# Patient Record
Sex: Female | Born: 1935 | Race: White | Hispanic: No | Smoking: Never smoker
Health system: Southern US, Community
[De-identification: ages and names within clinical notes are randomized; demographics above are authoritative.]

## PROBLEM LIST (undated history)

## (undated) DIAGNOSIS — I10 Essential (primary) hypertension: Secondary | ICD-10-CM

## (undated) HISTORY — PX: CHOLECYSTECTOMY: SHX55

## (undated) HISTORY — PX: ABDOMINAL HYSTERECTOMY: SHX81

---

## 2001-04-23 ENCOUNTER — Ambulatory Visit (HOSPITAL_COMMUNITY): Admission: RE | Admit: 2001-04-23 | Discharge: 2001-04-23 | Payer: Self-pay | Admitting: General Surgery

## 2001-08-16 ENCOUNTER — Encounter: Payer: Self-pay | Admitting: Family Medicine

## 2001-08-16 ENCOUNTER — Ambulatory Visit (HOSPITAL_COMMUNITY): Admission: RE | Admit: 2001-08-16 | Discharge: 2001-08-16 | Payer: Self-pay | Admitting: Family Medicine

## 2001-08-23 ENCOUNTER — Ambulatory Visit (HOSPITAL_COMMUNITY): Admission: RE | Admit: 2001-08-23 | Discharge: 2001-08-23 | Payer: Self-pay | Admitting: Family Medicine

## 2001-08-23 ENCOUNTER — Encounter: Payer: Self-pay | Admitting: Family Medicine

## 2002-04-13 ENCOUNTER — Ambulatory Visit (HOSPITAL_COMMUNITY): Admission: RE | Admit: 2002-04-13 | Discharge: 2002-04-13 | Payer: Self-pay | Admitting: Family Medicine

## 2002-04-13 ENCOUNTER — Encounter: Payer: Self-pay | Admitting: Family Medicine

## 2002-10-13 ENCOUNTER — Emergency Department (HOSPITAL_COMMUNITY): Admission: EM | Admit: 2002-10-13 | Discharge: 2002-10-13 | Payer: Self-pay | Admitting: Emergency Medicine

## 2002-10-13 ENCOUNTER — Encounter: Payer: Self-pay | Admitting: Emergency Medicine

## 2005-09-05 ENCOUNTER — Observation Stay (HOSPITAL_COMMUNITY): Admission: EM | Admit: 2005-09-05 | Discharge: 2005-09-07 | Payer: Self-pay | Admitting: Emergency Medicine

## 2006-12-21 ENCOUNTER — Ambulatory Visit (HOSPITAL_COMMUNITY): Admission: RE | Admit: 2006-12-21 | Discharge: 2006-12-21 | Payer: Self-pay | Admitting: Family Medicine

## 2008-04-09 ENCOUNTER — Emergency Department (HOSPITAL_COMMUNITY): Admission: EM | Admit: 2008-04-09 | Discharge: 2008-04-09 | Payer: Self-pay | Admitting: Emergency Medicine

## 2009-05-12 ENCOUNTER — Emergency Department (HOSPITAL_COMMUNITY): Admission: EM | Admit: 2009-05-12 | Discharge: 2009-05-12 | Payer: Self-pay | Admitting: Emergency Medicine

## 2011-03-28 NOTE — H&P (Signed)
Ellen [name], Ellen [name]          ACCOUNT NO.:  [id number]   MEDICAL RECORD NO.:  [id number]          PATIENT TYPE:  OBV   LOCATION:  IC03                          FACILITY:  [facility]   PHYSICIAN:  Angus G. Renard Matter, MD   DATE OF BIRTH:  [birth year]   DATE OF ADMISSION:  09/05/2005  DATE OF DISCHARGE:  LH                                HISTORY & PHYSICAL   A 77 year old white female presented to the ED after episode of syncope  which occurred at home. The patient apparently had had several episodes of  dizziness throughout the day. She apparently had been having problems with  sinus with some pain over forehead. She was standing, states she had episode  of syncope, fell and struck her head on table. She was seen by ED physician  and evaluated. Chest x-ray:  No acute disease. EKG:  Normal sinus rhythm.  Lab data:  CBC:  WBC 6,600, hemoglobin 14.1, hematocrit 41.4. Chemistries:  Sodium 133, potassium 3.1, chloride 100, CO2 25, glucose 132, BUN 14,  creatinine 0.8. Myoglobin 181, CK-MB 1.6, troponin 1. The patient was  subsequently admitted.   SOCIAL HISTORY:  The patient does not smoke or drink alcohol.   FAMILY HISTORY:  See previous record.   PAST MEDICAL AND SURGICAL HISTORY:  The patient has a history of  hypertension. No previous surgery.   REVIEW OF SYSTEMS:  HEENT:  The patient has had frontal headache and  dizziness intermittently. GASTROINTESTINAL:  No nausea, vomiting, or  diarrhea. GENITOURINARY:  No dysuria or hematuria.   PHYSICAL EXAMINATION:  VITAL SIGNS:  Blood pressure 140/60 sitting, standing  155/68; pulse 81; respirations 18.  HEENT:  Eyes:  PERRLA. TMs negative. Oropharynx benign.  NECK:  Supple. No JVD or thyroid abnormality.  LUNGS:  Clear to P&A.  HEART:  Regular rhythm.  ABDOMEN:  No palpable organs or masses.  SKIN:  Warm and dry.  EXTREMITIES:  Free of edema.  NEUROLOGICAL:  No focal deficit. Cranial nerves intact.   DIAGNOSIS:  Syncope, probable  sinusitis.     Angus G. Renard Matter, MD  Electronically Signed    AGM/MEDQ  D:  09/06/2005  T:  09/06/2005  Job:  [id number]

## 2011-03-28 NOTE — Procedures (Signed)
[facility]  Patient:    Ellen [name], Ellen [name] Visit Number: [id number] MRN: [id number]          Service Type: OUT Location: RAD Attending Physician:  Lilyan Punt Dictated by:   Lilyan Punt, M.D. Proc. Date: 08/23/01 Admit Date:  08/23/2001                                Stress Test  PROCEDURE:  Stress Cardiolite test.  PHYSICIAN:  Mel Almond, M.D.  INDICATION:  Chest discomfort.  PROTOCOL:  Bruce protocol Cardiolite, resting EKG.  There is some ST segment flattening and depression in II and III and also to some degree in V4 and V5.  Resting blood pressure 140/70.  HEART RATE RESPONSE EXERCISE:  The patient had a very quick increase in heart rate with exercise.  The patients heart rate went up to 118 before she even got onto the treadmill.  She stated that she was nervous.  She reached a peak heart rate of 155 during the exercise phase, and this was with the incline taken off and the speed at approximately 2.2 miles per hour.  This is more similar to what she exercises at home.  She was unable to keep pace on that incline in stage 1.  She did not have good recovery of heart rate in the recovery phase.  PVCs: The patient did have some couplets during the exercise phase and occasional individual PVCs.  ST SEGMENT RESPONSE EXERCISE:  She did have some ST segment depressions in V4 through V6, also lead II and III.  This was more pronounced when her heart rate was above 130 and was of significant level in V2, V4, V5, but was upsloping.  But at 0.08 past the J point was still significant.  BLOOD PRESSURE RESPONSE EXERCISE: Had a mild hypertensive response but not severe.  This was more noticed during recovery phase.  RECOVERY:  Uneventful.  SYMPTOMATOLOGY:  None.  INTERPRETATION:  Abnormal stress test.  Await Cardiolite images.  Concerning regarding patients poor exercise tolerance and also the degree of segment depression from  minimal activity. Dictated by:   Lilyan Punt, M.D. Attending Physician:  Lilyan Punt DD:  08/23/01 TD:  08/23/01 Job: [id number]

## 2011-03-28 NOTE — Group Therapy Note (Signed)
[name], Ellen [name]          ACCOUNT NO.:  [id number]   MEDICAL RECORD NO.:  [id number]          PATIENT TYPE:  OBV   LOCATION:  A227                          FACILITY:  [facility]   PHYSICIAN:  Angus G. Renard Matter, MD   DATE OF BIRTH:  06/19/1934   DATE OF PROCEDURE:  09/07/2005  DATE OF DISCHARGE:  09/07/2005                                   PROGRESS NOTE   SUBJECTIVE:  This patient was admitted following an episode of syncope.  She  was evaluated in the emergency department and subsequently was admitted.  She has had no further syncopal episodes.   OBJECTIVE:  VITAL SIGNS:  Blood pressure 121/63, respirations 18, pulse 62,  temperature 97.2.  HEART:  Regular rhythm.  LUNGS:  Clear to P&A.  ABDOMEN:  No palpable organs or masses.   LABORATORY DATA:  Chemistries:  Sodium 133, potassium 3.1, chloride 100.   ASSESSMENT:  The patient was admitted with episode of syncope.  She does  have hypokalemia.  Will receive potassium.  Appears stable.      Angus G. Renard Matter, MD  Electronically Signed     AGM/MEDQ  D:  09/07/2005  T:  09/08/2005  Job:  [id number]

## 2011-03-28 NOTE — Group Therapy Note (Signed)
[name], [name]          ACCOUNT NO.:  [id number]   MEDICAL RECORD NO.:  [id number]          PATIENT TYPE:  OBV   LOCATION:  IC03                          FACILITY:  [facility]   PHYSICIAN:  Angus G. Renard Matter, MD   DATE OF BIRTH:  1934-01-09   DATE OF PROCEDURE:  09/06/2005  DATE OF DISCHARGE:                                   PROGRESS NOTE   SUBJECTIVE:  This patient was admitted following episode of syncope.  She  was evaluated in the emergency department and subsequently admitted.  She  was admitted following an episode of syncope, but has remained stable since  she has been in the hospital.  She was placed in intensive care as an  overflow patient.  The patient has had history of hysterectomy and  gallbladder removal.   OBJECTIVE:  VITAL SIGNS:  Blood pressure 128/50, respirations 16, pulse 78,  temperature 99.6.  HEENT:  Slight congestion of nasal passages.  Slight tenderness over  forehead.  HEART:  Regular rhythm.  LUNGS:  Clear to P&A.  ABDOMEN:  No palpable organs or masses.   ASSESSMENT:  The patient was admitted with syncope of undetermined etiology.  She has had sinus infection over the past few days.      Angus G. Renard Matter, MD  Electronically Signed     AGM/MEDQ  D:  09/06/2005  T:  09/06/2005  Job:  [id number]

## 2011-03-28 NOTE — H&P (Signed)
NAME:  Ellen [name], Ellen [name]              ACCOUNT NO.:  0   MEDICAL RECORD NO.:  [id number]          PATIENT TYPE:  OBV   LOCATION:  IC03                          FACILITY:  [facility]   PHYSICIAN:  Hanley Hays. Dechurch, M.D.DATE OF BIRTH:  02/03/1934   DATE OF ADMISSION:  09/05/2005  DATE OF DISCHARGE:  LH                                HISTORY & PHYSICAL   The patient is a 77 year old Caucasian female followed by Dr. Butch Penny  with a past medical history of remarkable for hypertension who was in her  usual state of health until today when she noted as she was sitting writing  checks that she felt somewhat lightheaded and dizzy.  There was no frank  vertigo.  No nausea but she did note a flushing-type sensation.  She had a  similar episode later in the day after standing from a squatting position  but it passed and then as she was walking into her house this evening, she  was checking her answering machine messages and apparently had a syncopal  episode.  Her daughter was present. She stated her eyes were open but she  was unresponsive for about two minutes.  They summoned EMS.  The patient  came to and was back to her baseline mental status.  She had no evidence of  seizure activity.  There was no incontinence.  She had no nausea or  diaphoresis.  She has had no chest pain.  She is a very active 77 year old  who was essentially healthy until this episode.  She is now complaining of  some slight headache where she hit her head but no change in mental status  or other findings.  The patient did take a Tylenol Sinus last p.m. but is  not taking any other new medications.  Orthostatic blood pressures were  checked in the emergency room and she had no evidence of orthostasis.   CURRENT MEDICATIONS:  Her current medical regimen consists of:  1.  Lisinopril 10 mg daily.  2.  Hydrochlorothiazide 25 mg daily.   ALLERGIES:  She states she is allergic to aspirin and that it makes her  heart  race.   SOCIAL HISTORY:  She has no alcohol or tobacco abuse.  She is widowed for  four years.  She has two daughters who are supportive. 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   FAMILY HISTORY:  Unremarkable.  A brother died of what sounds like rheumatic  heart disease at a young age.  Parents were elderly.  She knows no details  of their medical history.   PAST MEDICAL HISTORY:  1.  Hypertension.  2.  History of sinus problems though not severe.  3.  History of hysterectomy complicated by postoperative PE.  4.  Cholecystectomy.   PHYSICAL EXAMINATION:  GENERAL:  Well-developed, well-nourished female.  VITAL SIGNS:  Blood pressure 155/60.  Pulse is in the 70's and 80's.  Sinus  rhythm with an occasional PAC.  Oxygen saturation 99% on 2 L.  NECK:  Supple.  No JVD, adenopathy or thyromegaly.  LUNGS:  Clear  to auscultation anterior and posterior.  HEART:  Regular rate and rhythm without murmur, gallop or rub.  ABDOMEN:  Soft, nontender.  EXTREMITIES:  Without clubbing or cyanosis.  There is no edema.  NEUROLOGIC:  She moves all extremities x4 with good grip.  Her mental status  is completely intact.  She has no evidence of ataxia. There is no nystagmus.  The symptoms are not able to be reproduced with sitting, standing, nor with  rapid head movement.   LABORATORY DATA:  CMP and CBC are pertinent for potassium 3.1; otherwise  unremarkable.  Point-of-care enzymes are normal.  EKG reveals nonspecific  lateral ST changes but no acute change.  Normal sinus rhythm.   ASSESSMENT/PLAN:  Near syncopal/syncopal episode, suggestive of orthostasis  given the flushing sensation though not demonstrated here in the emergency  room.  Cannot document arrhythmia.  Doubt that this is representing  transient ischemic attack.  Also not consistent with vestibulopathy.  The  patient is going to be admitted to the hospital for observation and  telemetry.  Will replete her potassium and  monitor.  Further work-up as  indicated.      Hanley Hays Josefine Class, M.D.  Electronically Signed     FED/MEDQ  D:  09/05/2005  T:  09/06/2005  Job:  [id number]

## 2013-09-19 ENCOUNTER — Other Ambulatory Visit (HOSPITAL_COMMUNITY): Payer: Self-pay | Admitting: Family Medicine

## 2013-09-19 DIAGNOSIS — R1032 Left lower quadrant pain: Secondary | ICD-10-CM

## 2013-09-20 ENCOUNTER — Ambulatory Visit (HOSPITAL_COMMUNITY)
Admission: RE | Admit: 2013-09-20 | Discharge: 2013-09-20 | Disposition: A | Discharge status: Home / Self Care | Payer: Medicare Other | Source: Ambulatory Visit | Attending: Family Medicine | Admitting: Family Medicine

## 2013-09-20 DIAGNOSIS — K869 Disease of pancreas, unspecified: Secondary | ICD-10-CM | POA: Insufficient documentation

## 2013-09-20 DIAGNOSIS — D7389 Other diseases of spleen: Secondary | ICD-10-CM | POA: Insufficient documentation

## 2013-09-20 DIAGNOSIS — R1032 Left lower quadrant pain: Secondary | ICD-10-CM | POA: Insufficient documentation

## 2013-09-20 DIAGNOSIS — R911 Solitary pulmonary nodule: Secondary | ICD-10-CM | POA: Insufficient documentation

## 2013-09-20 MED ORDER — IOHEXOL 300 MG/ML  SOLN
100.0000 mL | Freq: Once | INTRAMUSCULAR | Status: AC | PRN
  Administered 2013-09-20: 100 mL via INTRAVENOUS

## 2013-09-20 MED ORDER — SODIUM CHLORIDE 0.9 % IJ SOLN
INTRAMUSCULAR | Status: AC
  Filled 2013-09-20: qty 250

## 2013-10-18 ENCOUNTER — Emergency Department (HOSPITAL_COMMUNITY)
Admission: EM | Admit: 2013-10-18 | Discharge: 2013-10-19 | Disposition: A | Discharge status: Home / Self Care | Payer: Medicare Other | Attending: Emergency Medicine | Admitting: Emergency Medicine

## 2013-10-18 ENCOUNTER — Encounter (HOSPITAL_COMMUNITY): Payer: Self-pay | Admitting: Emergency Medicine

## 2013-10-18 ENCOUNTER — Emergency Department (HOSPITAL_COMMUNITY): Payer: Medicare Other

## 2013-10-18 DIAGNOSIS — R1012 Left upper quadrant pain: Secondary | ICD-10-CM | POA: Insufficient documentation

## 2013-10-18 DIAGNOSIS — R109 Unspecified abdominal pain: Secondary | ICD-10-CM

## 2013-10-18 DIAGNOSIS — Z79899 Other long term (current) drug therapy: Secondary | ICD-10-CM | POA: Insufficient documentation

## 2013-10-18 DIAGNOSIS — I10 Essential (primary) hypertension: Secondary | ICD-10-CM | POA: Insufficient documentation

## 2013-10-18 DIAGNOSIS — Z90710 Acquired absence of both cervix and uterus: Secondary | ICD-10-CM | POA: Insufficient documentation

## 2013-10-18 DIAGNOSIS — Z9089 Acquired absence of other organs: Secondary | ICD-10-CM | POA: Insufficient documentation

## 2013-10-18 HISTORY — DX: Essential (primary) hypertension: I10

## 2013-10-18 LAB — COMPREHENSIVE METABOLIC PANEL
ALT: 21 U/L (ref 0–35)
Alkaline Phosphatase: 59 U/L (ref 39–117)
BUN: 15 mg/dL (ref 6–23)
CO2: 27 mEq/L (ref 19–32)
Chloride: 93 mEq/L — ABNORMAL LOW (ref 96–112)
GFR calc Af Amer: 64 mL/min — ABNORMAL LOW (ref >90)
GFR calc non Af Amer: 55 mL/min — ABNORMAL LOW (ref >90)
Glucose, Bld: 129 mg/dL — ABNORMAL HIGH (ref 70–99)
Potassium: 3.6 mEq/L (ref 3.5–5.1)
Sodium: 133 mEq/L — ABNORMAL LOW (ref 135–145)
Total Bilirubin: 0.6 mg/dL (ref 0.3–1.2)
Total Protein: 8 g/dL (ref 6.0–8.3)

## 2013-10-18 LAB — CBC WITH DIFFERENTIAL/PLATELET
Eosinophils Absolute: 0.4 10*3/uL (ref 0.0–0.7)
Eosinophils Relative: 4 % (ref 0–5)
HCT: 40.7 % (ref 36.0–46.0)
Hemoglobin: 13.5 g/dL (ref 12.0–15.0)
Lymphocytes Relative: 15 % (ref 12–46)
Lymphs Abs: 1.3 10*3/uL (ref 0.7–4.0)
MCH: 30.4 pg (ref 26.0–34.0)
MCHC: 33.2 g/dL (ref 30.0–36.0)
Monocytes Absolute: 0.7 10*3/uL (ref 0.1–1.0)
Monocytes Relative: 8 % (ref 3–12)
Neutrophils Relative %: 72 % (ref 43–77)
Platelets: 121 10*3/uL — ABNORMAL LOW (ref 150–400)
RBC: 4.44 MIL/uL (ref 3.87–5.11)
RDW: 13.5 % (ref 11.5–15.5)

## 2013-10-18 LAB — LIPASE, BLOOD: Lipase: 46 U/L (ref 11–59)

## 2013-10-18 MED ORDER — HYDROCODONE-ACETAMINOPHEN 5-325 MG PO TABS: 1.0000 | ORAL_TABLET | ORAL | Status: DC | PRN

## 2013-10-18 MED ORDER — RANITIDINE HCL 150 MG PO TABS: 150.0000 mg | ORAL_TABLET | Freq: Two times a day (BID) | ORAL | Status: DC

## 2013-10-18 NOTE — ED Provider Notes (Signed)
CSN: [id number]     Arrival date & time 10/18/13  2154 History   First MD Initiated Contact with Patient 10/18/13 2215     Chief Complaint  Patient presents with  . Abdominal Pain   (Consider location/radiation/quality/duration/timing/severity/associated sxs/prior Treatment) HPI Comments: Patient presents to the ER for evaluation of abdominal pain. This has been experiencing pain in the left upper abdomen from [place]. She was recently diagnosed with a mass in her pancreas. She is scheduled to see a specialist at [facility] but this has not occurred yet. Patient reports that the pain worsened today. Feels like "a lot of gas". She has not had nausea, vomiting or diarrhea.  Patient is a 79 y.o. female presenting with abdominal pain.  Abdominal Pain   Past Medical History  Diagnosis Date  . Hypertension    Past Surgical History  Procedure Laterality Date  . Abdominal hysterectomy    . Cholecystectomy     History reviewed. No pertinent family history. History  Substance Use Topics  . Smoking status: Never Smoker   . Smokeless tobacco: Not on file  . Alcohol Use: No   OB History   Grav Para Term Preterm Abortions TAB SAB Ect Mult Living                 Review of Systems  Gastrointestinal: Positive for abdominal pain.  All other systems reviewed and are negative.    Allergies  Aspirin  Home Medications   Current Outpatient Rx  Name  Route  Sig  Dispense  Refill  . bimatoprost (LUMIGAN) 0.03 % ophthalmic solution   Both Eyes   Place 1 drop into both eyes at bedtime.         Marland Kitchen olmesartan-hydrochlorothiazide (BENICAR HCT) 40-12.5 MG per tablet   Oral   Take 1 tablet by mouth every morning.          BP 149/52  Pulse 96  Temp(Src) 98.5 F (36.9 C) (Oral)  Resp 18  Ht 5' 6.5" (1.689 m)  Wt 185 lb (83.915 kg)  BMI 29.42 kg/m2  SpO2 98% Physical Exam  Constitutional: She is oriented to person, place, and time. She appears well-developed and well-nourished. No  distress.  HENT:  Head: Normocephalic and atraumatic.  Right Ear: Hearing normal.  Left Ear: Hearing normal.  Nose: Nose normal.  Mouth/Throat: Oropharynx is clear and moist and mucous membranes are normal.  Eyes: Conjunctivae and EOM are normal. Pupils are equal, round, and reactive to light.  Neck: Normal range of motion. Neck supple.  Cardiovascular: Regular rhythm, S1 normal and S2 normal.  Exam reveals no gallop and no friction rub.   No murmur heard. Pulmonary/Chest: Effort normal and breath sounds normal. No respiratory distress. She exhibits no tenderness.  Abdominal: Soft. Normal appearance and bowel sounds are normal. There is no hepatosplenomegaly. There is tenderness in the left upper quadrant. There is no rebound, no guarding, no tenderness at McBurney's point and negative Murphy's sign. No hernia.  Musculoskeletal: Normal range of motion.  Neurological: She is alert and oriented to person, place, and time. She has normal strength. No cranial nerve deficit or sensory deficit. Coordination normal. GCS eye subscore is 4. GCS verbal subscore is 5. GCS motor subscore is 6.  Skin: Skin is warm, dry and intact. No rash noted. No cyanosis.  Psychiatric: She has a normal mood and affect. Her speech is normal and behavior is normal. Thought content normal.    ED Course  Procedures (including critical care time)  Labs Review Labs Reviewed  CBC WITH DIFFERENTIAL - Abnormal; Notable for the following:    Platelets 121 (*)    All other components within normal limits  COMPREHENSIVE METABOLIC PANEL - Abnormal; Notable for the following:    Sodium 133 (*)    Chloride 93 (*)    Glucose, Bld 129 (*)    GFR calc non Af Amer 55 (*)    GFR calc Af Amer 64 (*)    All other components within normal limits  LIPASE, BLOOD   Imaging Review Dg Abd Acute W/chest  10/18/2013   CLINICAL DATA:  Abdominal pain and distention.  EXAM: ACUTE ABDOMEN SERIES (ABDOMEN 2 VIEW & CHEST 1 VIEW)  COMPARISON:   Chest radiograph performed 09/05/2005, and CT of the abdomen and pelvis performed 09/20/2013  FINDINGS: The lungs are well-aerated. Mild left basilar opacity likely reflects atelectasis. There is no evidence of pleural effusion or pneumothorax. The cardiomediastinal silhouette is within normal limits.  The visualized bowel gas pattern is unremarkable. Scattered stool and air are seen within the colon; there is no evidence of small bowel dilatation to suggest obstruction. No free intra-abdominal air is identified on the provided upright view. Clips are noted within the right upper quadrant, reflecting prior cholecystectomy.  No acute osseous abnormalities are seen; sclerotic change is noted at the sacroiliac joints.  IMPRESSION: 1. Unremarkable bowel gas pattern; no free intra-abdominal air seen. 2. Mild left basilar airspace opacity likely reflects atelectasis; lungs otherwise grossly clear.   Electronically Signed   By: Roanna Raider M.D.   On: 10/18/2013 23:07    EKG Interpretation   None       MDM  Diagnosis: Abdominal pain  She wears pain in the left upper quadrant region and a known history of pancreatic mass. She has very benign tenderness in the left upper quadrant without guarding or rebound. Blood work is entirely normal. X-ray is normal, no sign of free air or obstruction. Patient will be treated symptomatically with analgesia, followup with her surgeon. Return if symptoms worsen.    Gilda Crease, MD 10/18/13 2352

## 2013-10-18 NOTE — ED Notes (Signed)
Pain LUQ for 1 month and "alot of gas" says she found out she has mass on pancreas . Is supposed to see Dr Dimas Aguas at [facility].   No NVD.

## 2013-11-11 ENCOUNTER — Other Ambulatory Visit (HOSPITAL_COMMUNITY): Payer: Self-pay | Admitting: Family Medicine

## 2013-11-11 ENCOUNTER — Ambulatory Visit (HOSPITAL_COMMUNITY)
Admission: RE | Admit: 2013-11-11 | Discharge: 2013-11-11 | Disposition: A | Discharge status: Home / Self Care | Payer: Medicare Other | Source: Ambulatory Visit | Attending: Family Medicine | Admitting: Family Medicine

## 2013-11-11 DIAGNOSIS — R509 Fever, unspecified: Secondary | ICD-10-CM | POA: Insufficient documentation

## 2013-11-11 DIAGNOSIS — R05 Cough: Secondary | ICD-10-CM

## 2013-11-11 DIAGNOSIS — R058 Other specified cough: Secondary | ICD-10-CM

## 2013-11-11 DIAGNOSIS — Z8509 Personal history of malignant neoplasm of other digestive organs: Secondary | ICD-10-CM | POA: Insufficient documentation

## 2013-11-11 DIAGNOSIS — R059 Cough, unspecified: Secondary | ICD-10-CM | POA: Insufficient documentation

## 2013-11-21 ENCOUNTER — Emergency Department (HOSPITAL_COMMUNITY): Payer: Medicare Other

## 2013-11-21 ENCOUNTER — Ambulatory Visit (HOSPITAL_COMMUNITY)
Admission: RE | Admit: 2013-11-21 | Discharge: 2013-11-21 | Disposition: A | Discharge status: Home / Self Care | Payer: Medicare Other | Source: Ambulatory Visit | Attending: Family Medicine | Admitting: Family Medicine

## 2013-11-21 ENCOUNTER — Encounter (HOSPITAL_COMMUNITY): Payer: Self-pay | Admitting: Emergency Medicine

## 2013-11-21 ENCOUNTER — Inpatient Hospital Stay (HOSPITAL_COMMUNITY)
Admission: EM | Admit: 2013-11-21 | Discharge: 2013-11-24 | DRG: 193 | Disposition: A | Discharge status: Home / Self Care | Payer: Medicare Other | Attending: Family Medicine | Admitting: Family Medicine

## 2013-11-21 ENCOUNTER — Other Ambulatory Visit (HOSPITAL_COMMUNITY): Payer: Self-pay | Admitting: Family Medicine

## 2013-11-21 DIAGNOSIS — C786 Secondary malignant neoplasm of retroperitoneum and peritoneum: Secondary | ICD-10-CM | POA: Diagnosis present

## 2013-11-21 DIAGNOSIS — C7800 Secondary malignant neoplasm of unspecified lung: Secondary | ICD-10-CM | POA: Diagnosis present

## 2013-11-21 DIAGNOSIS — C799 Secondary malignant neoplasm of unspecified site: Secondary | ICD-10-CM

## 2013-11-21 DIAGNOSIS — I10 Essential (primary) hypertension: Secondary | ICD-10-CM | POA: Diagnosis present

## 2013-11-21 DIAGNOSIS — J189 Pneumonia, unspecified organism: Principal | ICD-10-CM | POA: Diagnosis present

## 2013-11-21 DIAGNOSIS — Z66 Do not resuscitate: Secondary | ICD-10-CM | POA: Diagnosis present

## 2013-11-21 DIAGNOSIS — J40 Bronchitis, not specified as acute or chronic: Secondary | ICD-10-CM

## 2013-11-21 DIAGNOSIS — Z9089 Acquired absence of other organs: Secondary | ICD-10-CM

## 2013-11-21 DIAGNOSIS — R188 Other ascites: Secondary | ICD-10-CM | POA: Diagnosis present

## 2013-11-21 DIAGNOSIS — I2699 Other pulmonary embolism without acute cor pulmonale: Secondary | ICD-10-CM | POA: Diagnosis present

## 2013-11-21 DIAGNOSIS — C7889 Secondary malignant neoplasm of other digestive organs: Secondary | ICD-10-CM | POA: Diagnosis present

## 2013-11-21 DIAGNOSIS — A419 Sepsis, unspecified organism: Secondary | ICD-10-CM

## 2013-11-21 DIAGNOSIS — C259 Malignant neoplasm of pancreas, unspecified: Secondary | ICD-10-CM | POA: Diagnosis present

## 2013-11-21 DIAGNOSIS — K5900 Constipation, unspecified: Secondary | ICD-10-CM | POA: Diagnosis present

## 2013-11-21 DIAGNOSIS — Z90710 Acquired absence of both cervix and uterus: Secondary | ICD-10-CM

## 2013-11-21 DIAGNOSIS — J90 Pleural effusion, not elsewhere classified: Secondary | ICD-10-CM | POA: Diagnosis present

## 2013-11-21 DIAGNOSIS — C787 Secondary malignant neoplasm of liver and intrahepatic bile duct: Secondary | ICD-10-CM | POA: Diagnosis present

## 2013-11-21 LAB — BASIC METABOLIC PANEL
BUN: 24 mg/dL — ABNORMAL HIGH (ref 6–23)
CO2: 24 mEq/L (ref 19–32)
Calcium: 9.9 mg/dL (ref 8.4–10.5)
Chloride: 93 mEq/L — ABNORMAL LOW (ref 96–112)
Creatinine, Ser: 1.02 mg/dL (ref 0.50–1.10)
GFR calc Af Amer: 60 mL/min — ABNORMAL LOW (ref >90)
GFR calc non Af Amer: 52 mL/min — ABNORMAL LOW (ref >90)
Glucose, Bld: 141 mg/dL — ABNORMAL HIGH (ref 70–99)
Potassium: 4.1 mEq/L (ref 3.7–5.3)
Sodium: 134 mEq/L — ABNORMAL LOW (ref 137–147)

## 2013-11-21 LAB — CBC WITH DIFFERENTIAL/PLATELET
Basophils Absolute: 0 10*3/uL (ref 0.0–0.1)
Basophils Relative: 0 % (ref 0–1)
Eosinophils Absolute: 0.9 10*3/uL — ABNORMAL HIGH (ref 0.0–0.7)
Eosinophils Relative: 5 % (ref 0–5)
HCT: 39.1 % (ref 36.0–46.0)
Hemoglobin: 13.4 g/dL (ref 12.0–15.0)
Lymphocytes Relative: 7 % — ABNORMAL LOW (ref 12–46)
Lymphs Abs: 1.4 10*3/uL (ref 0.7–4.0)
MCH: 30.2 pg (ref 26.0–34.0)
MCHC: 34.3 g/dL (ref 30.0–36.0)
MCV: 88.1 fL (ref 78.0–100.0)
Monocytes Absolute: 2 10*3/uL — ABNORMAL HIGH (ref 0.1–1.0)
Monocytes Relative: 11 % (ref 3–12)
Neutro Abs: 14 10*3/uL — ABNORMAL HIGH (ref 1.7–7.7)
Neutrophils Relative %: 77 % (ref 43–77)
Platelets: 157 10*3/uL (ref 150–400)
RBC: 4.44 MIL/uL (ref 3.87–5.11)
RDW: 13.6 % (ref 11.5–15.5)
WBC: 18.2 10*3/uL — ABNORMAL HIGH (ref 4.0–10.5)

## 2013-11-21 MED ORDER — SODIUM CHLORIDE 0.9 % IV BOLUS (SEPSIS)
500.0000 mL | Freq: Once | INTRAVENOUS | Status: AC
  Administered 2013-11-21: 500 mL via INTRAVENOUS

## 2013-11-21 MED ORDER — ACETAMINOPHEN 500 MG PO TABS
1000.0000 mg | ORAL_TABLET | Freq: Once | ORAL | Status: AC
  Administered 2013-11-21: 1000 mg via ORAL
  Filled 2013-11-21: qty 2

## 2013-11-21 NOTE — ED Provider Notes (Signed)
CSN: [id number]     Arrival date & time 11/21/13  1925 History   First MD Initiated Contact with Patient 11/21/13 2255     Chief Complaint  Patient presents with  . Pneumonia   (Consider location/radiation/quality/duration/timing/severity/associated sxs/prior Treatment) HPI Comments: 80 year old female, recent diagnosis of likely pancreatic cancer, also has hypertension, has had a hysterectomy and cholecystectomy. She has been sick over the last 2 weeks, her illness has been defined by coughing, fevers, congestion. She has had 3 courses of antibiotics including Levaquin most recently. Her symptoms are persistent, chest x-ray done on January 2 showed a left-sided pleural effusion, repeated earlier today now showing left-sided infiltrate with the underlying effusion. The symptoms are persistent, nothing seems to make it better, nothing seems to make it worse, she denies diarrhea but did have one episode of possible vomiting or coughing up a large amount of phlegm prior to arrival.  Patient is a 80 y.o. female presenting with pneumonia. The history is provided by the patient, a relative and medical records.  Pneumonia    Past Medical History  Diagnosis Date  . Hypertension    Past Surgical History  Procedure Laterality Date  . Abdominal hysterectomy    . Cholecystectomy     No family history on file. History  Substance Use Topics  . Smoking status: Never Smoker   . Smokeless tobacco: Not on file  . Alcohol Use: No   OB History   Grav Para Term Preterm Abortions TAB SAB Ect Mult Living                 Review of Systems  All other systems reviewed and are negative.    Allergies  Aspirin  Home Medications   Current Outpatient Rx  Name  Route  Sig  Dispense  Refill  . bimatoprost (LUMIGAN) 0.03 % ophthalmic solution   Both Eyes   Place 1 drop into both eyes at bedtime.         . Difluprednate (DUREZOL) 0.05 % EMUL   Left Eye   Place 1 drop into the left eye daily.          Marland Kitchen HYDROcodone-acetaminophen (NORCO/VICODIN) 5-325 MG per tablet   Oral   Take 1-2 tablets by mouth every 4 (four) hours as needed.   20 tablet   0   . levofloxacin (LEVAQUIN) 500 MG tablet   Oral   Take 500 mg by mouth daily. 7 day course starting on 11/16/2013         . Nepafenac (ILEVRO) 0.3 % SUSP   Left Eye   Place 1 drop into the left eye daily.         Marland Kitchen olmesartan-hydrochlorothiazide (BENICAR HCT) 40-12.5 MG per tablet   Oral   Take 1 tablet by mouth every morning.         Marland Kitchen oxycodone (OXY-IR) 5 MG capsule   Oral   Take 5 mg by mouth every 4 (four) hours as needed for pain.         . OxyCODONE (OXYCONTIN) 10 mg T12A 12 hr tablet   Oral   Take 10 mg by mouth every 12 (twelve) hours.          BP 114/57  Pulse 118  Temp(Src) 98.2 F (36.8 C) (Oral)  Resp 16  Ht 5' 6.5" (1.689 m)  Wt 185 lb (83.915 kg)  BMI 29.42 kg/m2  SpO2 94% Physical Exam  Nursing note and vitals reviewed. Constitutional: She appears well-developed and well-nourished.  No distress.  HENT:  Head: Normocephalic and atraumatic.  Mouth/Throat: Oropharynx is clear and moist. No oropharyngeal exudate.  Eyes: Conjunctivae and EOM are normal. Pupils are equal, round, and reactive to light. Right eye exhibits no discharge. Left eye exhibits no discharge. No scleral icterus.  Neck: Normal range of motion. Neck supple. No JVD present. No thyromegaly present.  Cardiovascular: Regular rhythm, normal heart sounds and intact distal pulses.  Exam reveals no gallop and no friction rub.   No murmur heard. Tachycardic to 110 beats with strong pulses at the radial arteries  Pulmonary/Chest: Effort normal and breath sounds normal. No respiratory distress. She has no wheezes. She has no rales.  No increased work of breathing, no wheezes, no rales  Abdominal: Soft. Bowel sounds are normal. She exhibits no distension and no mass. There is tenderness ( Mild tenderness in the epigastrium, no guarding, no  masses, no peritoneal signs).  Musculoskeletal: Normal range of motion. She exhibits no edema and no tenderness.  Lymphadenopathy:    She has no cervical adenopathy.  Neurological: She is alert. Coordination normal.  Skin: Skin is warm and dry. No rash noted. No erythema.  Psychiatric: She has a normal mood and affect. Her behavior is normal.    ED Course  Procedures (including critical care time) Labs Review Labs Reviewed  CBC WITH DIFFERENTIAL - Abnormal; Notable for the following:    WBC 18.2 (*)    Neutro Abs 14.0 (*)    Lymphocytes Relative 7 (*)    Monocytes Absolute 2.0 (*)    Eosinophils Absolute 0.9 (*)    All other components within normal limits  BASIC METABOLIC PANEL - Abnormal; Notable for the following:    Sodium 134 (*)    Chloride 93 (*)    Glucose, Bld 141 (*)    BUN 24 (*)    GFR calc non Af Amer 52 (*)    GFR calc Af Amer 60 (*)    All other components within normal limits  CULTURE, BLOOD (ROUTINE X 2)  CULTURE, BLOOD (ROUTINE X 2)  INFLUENZA PANEL BY PCR (TYPE A & B, H1N1)  LACTIC ACID, PLASMA   Imaging Review Dg Chest 2 View  11/21/2013   CLINICAL DATA:  Cough, congestion, shortness of breath  EXAM: CHEST  2 VIEW  COMPARISON:  DG CHEST 2 VIEW dated 11/11/2013; CT ABD - PELV W/ CM dated 09/20/2013  FINDINGS: There is a small left pleural effusion. There is left basilar airspace disease concerning for pneumonia. The right lung is clear. There is no pneumothorax. Normal cardiomediastinal silhouette. Unremarkable osseous structures.  IMPRESSION: Left lower lobe pneumonia. Small parapneumonic effusion. Given the persistent abnormality further evaluation with a CT of the chest is recommended.   Electronically Signed   By: Kathreen Devoid   On: 11/21/2013 11:06   Ct Angio Chest Pe W/cm &/or Wo Cm  11/22/2013   CLINICAL DATA:  Recent diagnosis of pancreatic cancer in November, 2014, presenting with fever, epigastric abdominal pain, and left-sided chest pain. Surgical  history includes cholecystectomy and hysterectomy.  EXAM: CT ANGIOGRAPHY CHEST  CT ABDOMEN AND PELVIS WITH CONTRAST  TECHNIQUE: Multidetector CT imaging of the chest was performed using the standard protocol during bolus administration of intravenous contrast. Multiplanar CT image reconstructions including MIPs were obtained to evaluate the vascular anatomy. Multidetector CT imaging of the abdomen and pelvis was performed using the standard protocol during bolus administration of intravenous contrast.  CONTRAST:  184mL OMNIPAQUE IOHEXOL 350 MG/ML IV. Oral  contrast was not administered at the request of the emergency physician.  COMPARISON:  No prior CT chest.  CT abdomen and pelvis 09/20/2013.  FINDINGS: CTA CHEST FINDINGS  Contrast opacification of the pulmonary arteries is good. Filling defects involving branches of the right lower lobe pulmonary artery. No filling defects identified elsewhere in either lung. No evidence of right heart strain. Heart mildly enlarged. No pericardial effusion. No visible coronary atherosclerosis. Mild atherosclerosis involving the thoracic aorta.  Large left pleural effusion. Dense consolidation with air bronchograms in the left lower lobe with a "drowned lung" appearance. Left lower lobe bronchi markedly narrowed by right hilar lymphadenopathy which will be detailed below. Central airways otherwise patent. No confluent airspace consolidation elsewhere in either lung. Multiple bilateral pulmonary nodules. Irregular, spiculated nodule in the right upper lobe measures approximately 1.7 x 1.4 x 1.4 cm. Minimal interstitial opacities in both lungs. No right pleural effusion. Mild atelectasis deep in the right lower lobe.  Extensive bilateral hilar and mediastinal lymphadenopathy. Index right hilar nodes approximate 3.3 x 2.2 cm and index left hilar nodes approximate 3.2 x 1.4 cm (series 5, image 40). Index subcarinal mediastinal lymph node approximates 2.2 x 3.7 cm (image 42). No  axillary lymphadenopathy. Visualized thyroid gland unremarkable.  Bone window images demonstrate diffuse thoracic spondylosis but no evidence of osseous metastatic disease.  CT ABDOMEN and PELVIS FINDINGS  Necrotic mass involving the tail of the pancreas with extension to the splenic hilum and probable involvement of the spleen measures approximately 6.9 x 5.3 by 5.6 cm (series 16, image 15 and series 13, image 57), increased since the prior examination. The mass likely also involves the gastric fundus along its greater curvature. Numerous hepatic parenchymal metastases which were not present on the prior examination. Largest index lesion in the anterior segment right lobe measures approximately 3.0 x 3.3 x 4.0 cm (series 16, image 7 and series 13, image 55). Large metastasis involving the lower pole of the spleen, with several smaller splenic metastases, also a new finding; the largest metastasis in the lower pole of the spleen measures approximately 4.6 x 3.3 x 2.1 cm. Interval development of splenic vein thrombosis.  Numerous normal sized lymph nodes in the retroperitoneum and mesentery, some of which have increased in size since the prior examination. Interval development of widespread omental metastatic disease throughout the abdomen and pelvis. Small amount of ascites in the pelvis.  Normal adrenal glands. Nonobstructing bilateral lower pole renal calculi, the largest stone approximating 7 mm in a lower pole calix of the left kidney. Multiple bilateral parapelvic renal cysts. Large cortical cyst arising from the mid right kidney approximating 5.3 x 4.4 cm. No obstructing ureteral calculi. Mild aortoiliac atherosclerosis.  Small bowel of normal caliber throughout, though some of the anterior small bowel loops may be tethered by the omental metastatic disease. Moderate stool burden throughout the colon which is normal in appearance.  Urinary bladder unremarkable. Uterus surgically absent. Numerous pelvic  phleboliths.  Bone window images demonstrate degenerative changes throughout the lumbar spine, degenerative changes in the sacroiliac joints, and degenerative changes in the symphysis pubis.  Review of the MIP images confirms the above findings.  IMPRESSION: CHEST:  1. Acute pulmonary emboli involving segmental branches of the right lower lobe pulmonary artery. Clot burden is small. 2. Numerous pulmonary parenchymal metastases, the largest index lesion in the right upper lobe measured above. 3. Extensive bilateral hilar lymphadenopathy. Metastatic mediastinal lymphadenopathy. Index nodes are measured above. 4. Narrowing of the left lower lobe bronchus  by a the left hilar lymphadenopathy, with postobstructive pneumonia involving the left lower lobe. 5. Large left pleural effusion.  ABDOMEN/PELVIS:  1. Interval increase in size of the necrotic mass involving the tail of the pancreas with likely extension into the spleen at the hilum an likely involvement of the fundus of the stomach. Measurements are given above. 2. Interval development of numerous liver metastases. 3. Interval development of widespread omental metastatic disease. 4. Small amount of ascites in the pelvis. 5. Nonobstructing bilateral lower pole renal calculi. 6. Bilateral parapelvic renal cysts and approximate 5 cm cortical cyst arising from the right kidney. 7. These results were called by telephone at the time of interpretation on 11/22/2013 at 2:15 AM to Dr. Eber Hong , who verbally acknowledged these results.   Electronically Signed   By: Hulan Saas M.D.   On: 11/22/2013 02:17   Ct Abdomen Pelvis W Contrast  11/22/2013   CLINICAL DATA:  Recent diagnosis of pancreatic cancer in November, 2014, presenting with fever, epigastric abdominal pain, and left-sided chest pain. Surgical history includes cholecystectomy and hysterectomy.  EXAM: CT ANGIOGRAPHY CHEST  CT ABDOMEN AND PELVIS WITH CONTRAST  TECHNIQUE: Multidetector CT imaging of the  chest was performed using the standard protocol during bolus administration of intravenous contrast. Multiplanar CT image reconstructions including MIPs were obtained to evaluate the vascular anatomy. Multidetector CT imaging of the abdomen and pelvis was performed using the standard protocol during bolus administration of intravenous contrast.  CONTRAST:  OMNIPAQUE IOHEXOL 350 MG/ML IV. Oral contrast was not administered at the request of the emergency physician.  COMPARISON:  No prior CT chest.  CT abdomen and pelvis 09/20/2013.  FINDINGS: CTA CHEST FINDINGS  Contrast opacification of the pulmonary arteries is good. Filling defects involving branches of the right lower lobe pulmonary artery. No filling defects identified elsewhere in either lung. No evidence of right heart strain. Heart mildly enlarged. No pericardial effusion. No visible coronary atherosclerosis. Mild atherosclerosis involving the thoracic aorta.  Large left pleural effusion. Dense consolidation with air bronchograms in the left lower lobe with a "drowned lung" appearance. Left lower lobe bronchi markedly narrowed by right hilar lymphadenopathy which will be detailed below. Central airways otherwise patent. No confluent airspace consolidation elsewhere in either lung. Multiple bilateral pulmonary nodules. Irregular, spiculated nodule in the right upper lobe measures approximately 1.7 x 1.4 x 1.4 cm. Minimal interstitial opacities in both lungs. No right pleural effusion. Mild atelectasis deep in the right lower lobe.  Extensive bilateral hilar and mediastinal lymphadenopathy. Index right hilar nodes approximate 3.3 x 2.2 cm and index left hilar nodes approximate 3.2 x 1.4 cm (series 5, image 40). Index subcarinal mediastinal lymph node approximates 2.2 x 3.7 cm (image 42). No axillary lymphadenopathy. Visualized thyroid gland unremarkable.  Bone window images demonstrate diffuse thoracic spondylosis but no evidence of osseous metastatic  disease.  CT ABDOMEN and PELVIS FINDINGS  Necrotic mass involving the tail of the pancreas with extension to the splenic hilum and probable involvement of the spleen measures approximately 6.9 x 5.3 by 5.6 cm (series 16, image 15 and series 13, image 57), increased since the prior examination. The mass likely also involves the gastric fundus along its greater curvature. Numerous hepatic parenchymal metastases which were not present on the prior examination. Largest index lesion in the anterior segment right lobe measures approximately 3.0 x 3.3 x 4.0 cm (series 16, image 7 and series 13, image 55). Large metastasis involving the lower pole of the spleen, with  several smaller splenic metastases, also a new finding; the largest metastasis in the lower pole of the spleen measures approximately 4.6 x 3.3 x 2.1 cm. Interval development of splenic vein thrombosis.  Numerous normal sized lymph nodes in the retroperitoneum and mesentery, some of which have increased in size since the prior examination. Interval development of widespread omental metastatic disease throughout the abdomen and pelvis. Small amount of ascites in the pelvis.  Normal adrenal glands. Nonobstructing bilateral lower pole renal calculi, the largest stone approximating 7 mm in a lower pole calix of the left kidney. Multiple bilateral parapelvic renal cysts. Large cortical cyst arising from the mid right kidney approximating 5.3 x 4.4 cm. No obstructing ureteral calculi. Mild aortoiliac atherosclerosis.  Small bowel of normal caliber throughout, though some of the anterior small bowel loops may be tethered by the omental metastatic disease. Moderate stool burden throughout the colon which is normal in appearance.  Urinary bladder unremarkable. Uterus surgically absent. Numerous pelvic phleboliths.  Bone window images demonstrate degenerative changes throughout the lumbar spine, degenerative changes in the sacroiliac joints, and degenerative changes in  the symphysis pubis.  Review of the MIP images confirms the above findings.  IMPRESSION: CHEST:  1. Acute pulmonary emboli involving segmental branches of the right lower lobe pulmonary artery. Clot burden is small. 2. Numerous pulmonary parenchymal metastases, the largest index lesion in the right upper lobe measured above. 3. Extensive bilateral hilar lymphadenopathy. Metastatic mediastinal lymphadenopathy. Index nodes are measured above. 4. Narrowing of the left lower lobe bronchus by a the left hilar lymphadenopathy, with postobstructive pneumonia involving the left lower lobe. 5. Large left pleural effusion.  ABDOMEN/PELVIS:  1. Interval increase in size of the necrotic mass involving the tail of the pancreas with likely extension into the spleen at the hilum an likely involvement of the fundus of the stomach. Measurements are given above. 2. Interval development of numerous liver metastases. 3. Interval development of widespread omental metastatic disease. 4. Small amount of ascites in the pelvis. 5. Nonobstructing bilateral lower pole renal calculi. 6. Bilateral parapelvic renal cysts and approximate 5 cm cortical cyst arising from the right kidney. 7. These results were called by telephone at the time of interpretation on 11/22/2013 at 2:15 AM to Dr. Noemi Chapel , who verbally acknowledged these results.   Electronically Signed   By: Evangeline Dakin M.D.   On: 11/22/2013 02:17    EKG Interpretation   None       MDM   1. Pulmonary embolism   2. HCAP (healthcare-associated pneumonia)   3. Metastatic cancer   4. Sepsis    The patient does have a fever and a tachycardia with hypoxia to 94% on room air. She is not in respiratory distress, speaks in full sentences in her x-ray shows signs and symptoms consistent with pneumonia. Given her recent diagnosis of cancer and persistent symptoms despite multiple courses of antibiotics I will perform a CT angiogram of the chest to further evaluate for  possible thromboembolism or other sources of her lung abnormality. I would also consider lung metastasis with a postobstructive pneumonia. Antipyretics ordered, fluids, labs, patient appears comfortable at this time.  Initial x-ray shows that the patient has a left-sided infiltrate. This was performed in the office and I have reviewed his x-ray. Her white blood cell count is 18,200 with a left shift, renal function is preserved, lactic acid at 2. A CT angiogram of the chest shows that she has pulmonary embolism in the right lung, a  large amount of infectious and each fusion burden in the left lower lung as well as metastatic disease. Her abdomen CT also shows significant progression of her metastatic cancer.  Lovenox for anticoagulation, broad-spectrum antibiotics for her pulmonary infection which is sepsis given her fever, tachycardia and leukocytosis. Discussed with the hospitalist who will admit the hospitalist who will admit  CRITICAL CARE Performed by: Johnna Acosta Total critical care time: 35 Critical care time was exclusive of separately billable procedures and treating other patients. Critical care was necessary to treat or prevent imminent or life-threatening deterioration. Critical care was time spent personally by me on the following activities: development of treatment plan with patient and/or surrogate as well as nursing, discussions with consultants, evaluation of patient's response to treatment, examination of patient, obtaining history from patient or surrogate, ordering and performing treatments and interventions, ordering and review of laboratory studies, ordering and review of radiographic studies, pulse oximetry and re-evaluation of patient's condition.  Meds given in ED:  Medications  vancomycin (VANCOCIN) IVPB 1000 mg/200 mL premix (not administered)  piperacillin-tazobactam (ZOSYN) IVPB 3.375 g (3.375 g Intravenous New Bag/Given 11/22/13 0244)  sodium chloride 0.9 % bolus  500 mL (0 mLs Intravenous Stopped 11/22/13 0130)  acetaminophen (TYLENOL) tablet 1,000 mg (1,000 mg Oral Given 11/21/13 2324)  iohexol (OMNIPAQUE) 350 MG/ML injection 100 mL (100 mLs Intravenous Contrast Given 11/22/13 0103)  enoxaparin (LOVENOX) injection 85 mg (85 mg Subcutaneous Given 11/22/13 0249)      Johnna Acosta, MD 11/22/13 [phone]

## 2013-11-21 NOTE — ED Notes (Signed)
Review of medications - patient has completed one round of clindamycin and is finishing her second round of levaquin

## 2013-11-21 NOTE — ED Notes (Signed)
Saw Dr Everette Rank this am, had chest xray done due to symptoms for at least 3 weeks, cough, congestion, fever.  Pt has been on levaquin for same without improvement

## 2013-11-22 ENCOUNTER — Encounter (HOSPITAL_COMMUNITY): Payer: Self-pay | Admitting: Radiology

## 2013-11-22 ENCOUNTER — Other Ambulatory Visit (HOSPITAL_COMMUNITY): Payer: Self-pay | Admitting: Oncology

## 2013-11-22 DIAGNOSIS — I2699 Other pulmonary embolism without acute cor pulmonale: Secondary | ICD-10-CM

## 2013-11-22 DIAGNOSIS — J189 Pneumonia, unspecified organism: Principal | ICD-10-CM

## 2013-11-22 DIAGNOSIS — C801 Malignant (primary) neoplasm, unspecified: Secondary | ICD-10-CM

## 2013-11-22 DIAGNOSIS — C800 Disseminated malignant neoplasm, unspecified: Secondary | ICD-10-CM

## 2013-11-22 LAB — COMPREHENSIVE METABOLIC PANEL
ALT: 20 U/L (ref 0–35)
AST: 27 U/L (ref 0–37)
Albumin: 2.3 g/dL — ABNORMAL LOW (ref 3.5–5.2)
Alkaline Phosphatase: 121 U/L — ABNORMAL HIGH (ref 39–117)
BUN: 20 mg/dL (ref 6–23)
CO2: 22 mEq/L (ref 19–32)
Calcium: 9.2 mg/dL (ref 8.4–10.5)
Chloride: 96 mEq/L (ref 96–112)
Creatinine, Ser: 0.9 mg/dL (ref 0.50–1.10)
GFR calc non Af Amer: 60 mL/min — ABNORMAL LOW (ref >90)
GFR, EST AFRICAN AMERICAN: 70 mL/min — AB (ref >90)
GLUCOSE: 121 mg/dL — AB (ref 70–99)
Potassium: 3.4 mEq/L — ABNORMAL LOW (ref 3.7–5.3)
SODIUM: 136 meq/L — AB (ref 137–147)
TOTAL PROTEIN: 6.8 g/dL (ref 6.0–8.3)
Total Bilirubin: 0.8 mg/dL (ref 0.3–1.2)

## 2013-11-22 LAB — CBC
HEMATOCRIT: 37 % (ref 36.0–46.0)
HEMOGLOBIN: 12.6 g/dL (ref 12.0–15.0)
MCH: 30.2 pg (ref 26.0–34.0)
MCHC: 34.1 g/dL (ref 30.0–36.0)
MCV: 88.7 fL (ref 78.0–100.0)
Platelets: 160 10*3/uL (ref 150–400)
RBC: 4.17 MIL/uL (ref 3.87–5.11)
RDW: 14 % (ref 11.5–15.5)
WBC: 14 10*3/uL — ABNORMAL HIGH (ref 4.0–10.5)

## 2013-11-22 LAB — INFLUENZA PANEL BY PCR (TYPE A & B)
H1N1 flu by pcr: NOT DETECTED
Influenza A By PCR: NEGATIVE
Influenza B By PCR: NEGATIVE

## 2013-11-22 LAB — LACTIC ACID, PLASMA: LACTIC ACID, VENOUS: 2.1 mmol/L (ref 0.5–2.2)

## 2013-11-22 MED ORDER — ENOXAPARIN SODIUM 80 MG/0.8ML ~~LOC~~ SOLN
80.0000 mg | Freq: Two times a day (BID) | SUBCUTANEOUS | Status: DC
  Administered 2013-11-22 (×2): 80 mg via SUBCUTANEOUS
  Filled 2013-11-22 (×2): qty 0.8

## 2013-11-22 MED ORDER — NEPAFENAC 0.1 % OP SUSP
1.0000 [drp] | Freq: Every day | OPHTHALMIC | Status: DC
Start: 2013-11-22 — End: 2013-11-24
  Administered 2013-11-22 – 2013-11-24 (×3): 1 [drp] via OPHTHALMIC
  Filled 2013-11-22: qty 3

## 2013-11-22 MED ORDER — ONDANSETRON HCL 4 MG/2ML IJ SOLN: 4.0000 mg | Freq: Four times a day (QID) | INTRAMUSCULAR | Status: DC | PRN

## 2013-11-22 MED ORDER — VANCOMYCIN HCL IN DEXTROSE 750-5 MG/150ML-% IV SOLN
750.0000 mg | Freq: Two times a day (BID) | INTRAVENOUS | Status: DC
Start: 2013-11-22 — End: 2013-11-24
  Administered 2013-11-22 – 2013-11-24 (×5): 750 mg via INTRAVENOUS
  Filled 2013-11-22 (×7): qty 150

## 2013-11-22 MED ORDER — LATANOPROST 0.005 % OP SOLN
1.0000 [drp] | Freq: Every day | OPHTHALMIC | Status: DC
  Administered 2013-11-22 – 2013-11-23 (×2): 1 [drp] via OPHTHALMIC
  Filled 2013-11-22: qty 2.5

## 2013-11-22 MED ORDER — SODIUM CHLORIDE 0.9 % IV SOLN
INTRAVENOUS | Status: AC
  Administered 2013-11-22: 05:00:00 via INTRAVENOUS

## 2013-11-22 MED ORDER — PREDNISOLONE ACETATE 1 % OP SUSP
1.0000 [drp] | Freq: Every day | OPHTHALMIC | Status: DC
  Administered 2013-11-22 – 2013-11-24 (×3): 1 [drp] via OPHTHALMIC
  Filled 2013-11-22: qty 1

## 2013-11-22 MED ORDER — OXYCODONE HCL ER 10 MG PO T12A
10.0000 mg | EXTENDED_RELEASE_TABLET | Freq: Two times a day (BID) | ORAL | Status: DC
  Administered 2013-11-22 – 2013-11-24 (×5): 10 mg via ORAL
  Filled 2013-11-22 (×5): qty 1

## 2013-11-22 MED ORDER — DIFLUPREDNATE 0.05 % OP EMUL: 1.0000 [drp] | Freq: Every day | OPHTHALMIC | Status: DC

## 2013-11-22 MED ORDER — IOHEXOL 350 MG/ML SOLN
100.0000 mL | Freq: Once | INTRAVENOUS | Status: AC | PRN
  Administered 2013-11-22: 100 mL via INTRAVENOUS

## 2013-11-22 MED ORDER — OLMESARTAN MEDOXOMIL-HCTZ 40-12.5 MG PO TABS: 1.0000 | ORAL_TABLET | Freq: Every morning | ORAL | Status: DC

## 2013-11-22 MED ORDER — MAGNESIUM HYDROXIDE 400 MG/5ML PO SUSP
30.0000 mL | Freq: Every day | ORAL | Status: DC | PRN
  Administered 2013-11-23 (×2): 30 mL via ORAL
  Filled 2013-11-22 (×2): qty 30

## 2013-11-22 MED ORDER — ONDANSETRON HCL 4 MG PO TABS: 4.0000 mg | ORAL_TABLET | Freq: Four times a day (QID) | ORAL | Status: DC | PRN

## 2013-11-22 MED ORDER — PIPERACILLIN-TAZOBACTAM 3.375 G IVPB
3.3750 g | Freq: Three times a day (TID) | INTRAVENOUS | Status: DC
  Administered 2013-11-22 – 2013-11-24 (×7): 3.375 g via INTRAVENOUS
  Filled 2013-11-22 (×10): qty 50

## 2013-11-22 MED ORDER — ONDANSETRON HCL 4 MG/2ML IJ SOLN: 4.0000 mg | Freq: Three times a day (TID) | INTRAMUSCULAR | Status: AC | PRN

## 2013-11-22 MED ORDER — IRBESARTAN 300 MG PO TABS
300.0000 mg | ORAL_TABLET | Freq: Every day | ORAL | Status: DC
  Administered 2013-11-22 – 2013-11-24 (×3): 300 mg via ORAL
  Filled 2013-11-22 (×3): qty 1

## 2013-11-22 MED ORDER — OXYCODONE HCL 5 MG PO TABS
5.0000 mg | ORAL_TABLET | ORAL | Status: DC | PRN
  Administered 2013-11-22: 5 mg via ORAL
  Filled 2013-11-22: qty 1

## 2013-11-22 MED ORDER — VANCOMYCIN HCL IN DEXTROSE 1-5 GM/200ML-% IV SOLN
1000.0000 mg | Freq: Once | INTRAVENOUS | Status: AC
  Administered 2013-11-22: 1000 mg via INTRAVENOUS
  Filled 2013-11-22: qty 200

## 2013-11-22 MED ORDER — HYDROCHLOROTHIAZIDE 12.5 MG PO CAPS
12.5000 mg | ORAL_CAPSULE | Freq: Every day | ORAL | Status: DC
  Administered 2013-11-22 – 2013-11-24 (×3): 12.5 mg via ORAL
  Filled 2013-11-22 (×3): qty 1

## 2013-11-22 MED ORDER — ENOXAPARIN SODIUM 100 MG/ML ~~LOC~~ SOLN
1.0000 mg/kg | Freq: Once | SUBCUTANEOUS | Status: AC
  Administered 2013-11-22: 85 mg via SUBCUTANEOUS
  Filled 2013-11-22: qty 1

## 2013-11-22 MED ORDER — PIPERACILLIN-TAZOBACTAM 3.375 G IVPB 30 MIN
3.3750 g | Freq: Once | INTRAVENOUS | Status: AC
  Administered 2013-11-22: 3.375 g via INTRAVENOUS
  Filled 2013-11-22 (×2): qty 50

## 2013-11-22 NOTE — ED Notes (Signed)
Assisted to bathroom- weak but ambulated three feet without assistance (rolled wheelchair to bathroom)

## 2013-11-22 NOTE — H&P (Signed)
PCP:   Lanette Hampshire, MD   Chief Complaint:  Shortness of breath  HPI: 80 year old female with recent diagnosis of pancreatic cancer who is followed by oncologist at [facility] and was supposed to undergo surgery on 11/18/13, could not undergo surgery because of recurrent bronchitis. Patient has been sick over the past 2 weeks and has been having cough, fever congestion. Patient also had 3 course of antibiotics over the past 2 weeks. She denies chest pain, no nausea vomiting or diarrhea. In the ED patient was found to have left lower lobe pneumonia on the chest x-ray, CT chest and abdomen was done which showed pulmonary embolism and widespread metastatic disease.  Allergies:   Allergies  Allergen Reactions  . Aspirin     Makes my heart race      Past Medical History  Diagnosis Date  . Hypertension     Past Surgical History  Procedure Laterality Date  . Abdominal hysterectomy    . Cholecystectomy      Prior to Admission medications   Medication Sig Start Date End Date Taking? Authorizing Provider  bimatoprost (LUMIGAN) 0.03 % ophthalmic solution Place 1 drop into both eyes at bedtime.   Yes Historical Provider, MD  Difluprednate (DUREZOL) 0.05 % EMUL Place 1 drop into the left eye daily.   Yes Historical Provider, MD  HYDROcodone-acetaminophen (NORCO/VICODIN) 5-325 MG per tablet Take 1-2 tablets by mouth every 4 (four) hours as needed. 10/18/13  Yes Orpah Greek, MD  levofloxacin (LEVAQUIN) 500 MG tablet Take 500 mg by mouth daily. 7 day course starting on 11/16/2013   Yes Historical Provider, MD  Nepafenac (ILEVRO) 0.3 % SUSP Place 1 drop into the left eye daily.   Yes Historical Provider, MD  olmesartan-hydrochlorothiazide (BENICAR HCT) 40-12.5 MG per tablet Take 1 tablet by mouth every morning.   Yes Historical Provider, MD  oxycodone (OXY-IR) 5 MG capsule Take 5 mg by mouth every 4 (four) hours as needed for pain.   Yes Historical Provider, MD  OxyCODONE (OXYCONTIN)  10 mg T12A 12 hr tablet Take 10 mg by mouth every 12 (twelve) hours.   Yes Historical Provider, MD    Social History:  reports that she has never smoked. She does not have any smokeless tobacco history on file. She reports that she does not drink alcohol or use illicit drugs.    All the positives are listed in BOLD  Review of Systems:  HEENT: Headache, blurred vision, runny nose, sore throat Neck: Hypothyroidism, hyperthyroidism,,lymphadenopathy Chest : Shortness of breath, history of COPD, Asthma Heart : Chest pain, history of coronary arterey disease GI:  Nausea, vomiting, diarrhea, constipation, GERD GU: Dysuria, urgency, frequency of urination, hematuria Neuro: Stroke, seizures, syncope Psych: Depression, anxiety, hallucinations   Physical Exam: Blood pressure 114/57, pulse 118, temperature 98.2 F (36.8 C), temperature source Oral, resp. rate 16, height 5' 6.5" (1.689 m), weight 83.915 kg (185 lb), SpO2 94.00%. Constitutional:   Patient is a well-developed and well-nourished female in no acute distress and cooperative with exam. Head: Normocephalic and atraumatic Mouth: Mucus membranes moist Eyes: PERRL, EOMI, conjunctivae normal Neck: Supple, No Thyromegaly Cardiovascular: RRR, S1 normal, S2 normal Pulmonary/Chest: Bibasilar crackles Abdominal: Soft. Non-tender, non-distended, bowel sounds are normal, no masses, organomegaly, or guarding present.  Neurological: A&O x3, Strenght is normal and symmetric bilaterally, cranial nerve II-XII are grossly intact, no focal motor deficit, sensory intact to light touch bilaterally.  Extremities : No Cyanosis, Clubbing or Edema   Labs on Admission:  Results  for orders placed during the hospital encounter of 11/21/13 (from the past 48 hour(s))  CBC WITH DIFFERENTIAL     Status: Abnormal   Collection Time    11/21/13 11:01 PM      Result Value Range   WBC 18.2 (*) 4.0 - 10.5 K/uL   RBC 4.44  3.87 - 5.11 MIL/uL   Hemoglobin 13.4   12.0 - 15.0 g/dL   HCT 39.1  36.0 - 46.0 %   MCV 88.1  78.0 - 100.0 fL   MCH 30.2  26.0 - 34.0 pg   MCHC 34.3  30.0 - 36.0 g/dL   RDW 13.6  11.5 - 15.5 %   Platelets 157  150 - 400 K/uL   Neutrophils Relative % 77  43 - 77 %   Neutro Abs 14.0 (*) 1.7 - 7.7 K/uL   Lymphocytes Relative 7 (*) 12 - 46 %   Lymphs Abs 1.4  0.7 - 4.0 K/uL   Monocytes Relative 11  3 - 12 %   Monocytes Absolute 2.0 (*) 0.1 - 1.0 K/uL   Eosinophils Relative 5  0 - 5 %   Eosinophils Absolute 0.9 (*) 0.0 - 0.7 K/uL   Basophils Relative 0  0 - 1 %   Basophils Absolute 0.0  0.0 - 0.1 K/uL  BASIC METABOLIC PANEL     Status: Abnormal   Collection Time    11/21/13 11:01 PM      Result Value Range   Sodium 134 (*) 137 - 147 mEq/L   Potassium 4.1  3.7 - 5.3 mEq/L   Chloride 93 (*) 96 - 112 mEq/L   CO2 24  19 - 32 mEq/L   Glucose, Bld 141 (*) 70 - 99 mg/dL   BUN 24 (*) 6 - 23 mg/dL   Creatinine, Ser 1.02  0.50 - 1.10 mg/dL   Calcium 9.9  8.4 - 10.5 mg/dL   GFR calc non Af Amer 52 (*) >90 mL/min   GFR calc Af Amer 60 (*) >90 mL/min   Comment: (NOTE)     The eGFR has been calculated using the CKD EPI equation.     This calculation has not been validated in all clinical situations.     eGFR's persistently <90 mL/min signify possible Chronic Kidney     Disease.  INFLUENZA PANEL BY PCR (TYPE A & B, H1N1)     Status: None   Collection Time    11/21/13 11:20 PM      Result Value Range   Influenza A By PCR NEGATIVE  NEGATIVE   Influenza B By PCR NEGATIVE  NEGATIVE   H1N1 flu by pcr NOT DETECTED  NOT DETECTED   Comment:            The Xpert Flu assay (FDA approved for     nasal aspirates or washes and     nasopharyngeal swab specimens), is     intended as an aid in the diagnosis of     influenza and should not be used as     a sole basis for treatment.  LACTIC ACID, PLASMA     Status: None   Collection Time    11/22/13  2:36 AM      Result Value Range   Lactic Acid, Venous 2.1  0.5 - 2.2 mmol/L  CULTURE,  BLOOD (ROUTINE X 2)     Status: None   Collection Time    11/22/13  2:36 AM      Result  Value Range   Specimen Description BLOOD RIGHT HAND     Special Requests       Value: BOTTLES DRAWN AEROBIC AND ANAEROBIC AEB 2CC ANA 3CC   Culture PENDING     Report Status PENDING    CULTURE, BLOOD (ROUTINE X 2)     Status: None   Collection Time    11/22/13  2:36 AM      Result Value Range   Specimen Description BLOOD LEFT HAND     Special Requests BOTTLES DRAWN AEROBIC AND ANAEROBIC 2CC EACH     Culture PENDING     Report Status PENDING      Radiological Exams on Admission: Dg Chest 2 View  11/21/2013   CLINICAL DATA:  Cough, congestion, shortness of breath  EXAM: CHEST  2 VIEW  COMPARISON:  DG CHEST 2 VIEW dated 11/11/2013; CT ABD - PELV W/ CM dated 09/20/2013  FINDINGS: There is a small left pleural effusion. There is left basilar airspace disease concerning for pneumonia. The right lung is clear. There is no pneumothorax. Normal cardiomediastinal silhouette. Unremarkable osseous structures.  IMPRESSION: Left lower lobe pneumonia. Small parapneumonic effusion. Given the persistent abnormality further evaluation with a CT of the chest is recommended.   Electronically Signed   By: Kathreen Devoid   On: 11/21/2013 11:06   Ct Angio Chest Pe W/cm &/or Wo Cm  11/22/2013   CLINICAL DATA:  Recent diagnosis of pancreatic cancer in November, 2014, presenting with fever, epigastric abdominal pain, and left-sided chest pain. Surgical history includes cholecystectomy and hysterectomy.  EXAM: CT ANGIOGRAPHY CHEST  CT ABDOMEN AND PELVIS WITH CONTRAST  TECHNIQUE: Multidetector CT imaging of the chest was performed using the standard protocol during bolus administration of intravenous contrast. Multiplanar CT image reconstructions including MIPs were obtained to evaluate the vascular anatomy. Multidetector CT imaging of the abdomen and pelvis was performed using the standard protocol during bolus administration of  intravenous contrast.  CONTRAST:  161m OMNIPAQUE IOHEXOL 350 MG/ML IV. Oral contrast was not administered at the request of the emergency physician.  COMPARISON:  No prior CT chest.  CT abdomen and pelvis 09/20/2013.  FINDINGS: CTA CHEST FINDINGS  Contrast opacification of the pulmonary arteries is good. Filling defects involving branches of the right lower lobe pulmonary artery. No filling defects identified elsewhere in either lung. No evidence of right heart strain. Heart mildly enlarged. No pericardial effusion. No visible coronary atherosclerosis. Mild atherosclerosis involving the thoracic aorta.  Large left pleural effusion. Dense consolidation with air bronchograms in the left lower lobe with a "drowned lung" appearance. Left lower lobe bronchi markedly narrowed by right hilar lymphadenopathy which will be detailed below. Central airways otherwise patent. No confluent airspace consolidation elsewhere in either lung. Multiple bilateral pulmonary nodules. Irregular, spiculated nodule in the right upper lobe measures approximately 1.7 x 1.4 x 1.4 cm. Minimal interstitial opacities in both lungs. No right pleural effusion. Mild atelectasis deep in the right lower lobe.  Extensive bilateral hilar and mediastinal lymphadenopathy. Index right hilar nodes approximate 3.3 x 2.2 cm and index left hilar nodes approximate 3.2 x 1.4 cm (series 5, image 40). Index subcarinal mediastinal lymph node approximates 2.2 x 3.7 cm (image 42). No axillary lymphadenopathy. Visualized thyroid gland unremarkable.  Bone window images demonstrate diffuse thoracic spondylosis but no evidence of osseous metastatic disease.  CT ABDOMEN and PELVIS FINDINGS  Necrotic mass involving the tail of the pancreas with extension to the splenic hilum and probable involvement of the spleen measures  approximately 6.9 x 5.3 by 5.6 cm (series 16, image 15 and series 13, image 57), increased since the prior examination. The mass likely also involves the  gastric fundus along its greater curvature. Numerous hepatic parenchymal metastases which were not present on the prior examination. Largest index lesion in the anterior segment right lobe measures approximately 3.0 x 3.3 x 4.0 cm (series 16, image 7 and series 13, image 55). Large metastasis involving the lower pole of the spleen, with several smaller splenic metastases, also a new finding; the largest metastasis in the lower pole of the spleen measures approximately 4.6 x 3.3 x 2.1 cm. Interval development of splenic vein thrombosis.  Numerous normal sized lymph nodes in the retroperitoneum and mesentery, some of which have increased in size since the prior examination. Interval development of widespread omental metastatic disease throughout the abdomen and pelvis. Small amount of ascites in the pelvis.  Normal adrenal glands. Nonobstructing bilateral lower pole renal calculi, the largest stone approximating 7 mm in a lower pole calix of the left kidney. Multiple bilateral parapelvic renal cysts. Large cortical cyst arising from the mid right kidney approximating 5.3 x 4.4 cm. No obstructing ureteral calculi. Mild aortoiliac atherosclerosis.  Small bowel of normal caliber throughout, though some of the anterior small bowel loops may be tethered by the omental metastatic disease. Moderate stool burden throughout the colon which is normal in appearance.  Urinary bladder unremarkable. Uterus surgically absent. Numerous pelvic phleboliths.  Bone window images demonstrate degenerative changes throughout the lumbar spine, degenerative changes in the sacroiliac joints, and degenerative changes in the symphysis pubis.  Review of the MIP images confirms the above findings.  IMPRESSION: CHEST:  1. Acute pulmonary emboli involving segmental branches of the right lower lobe pulmonary artery. Clot burden is small. 2. Numerous pulmonary parenchymal metastases, the largest index lesion in the right upper lobe measured above. 3.  Extensive bilateral hilar lymphadenopathy. Metastatic mediastinal lymphadenopathy. Index nodes are measured above. 4. Narrowing of the left lower lobe bronchus by a the left hilar lymphadenopathy, with postobstructive pneumonia involving the left lower lobe. 5. Large left pleural effusion.  ABDOMEN/PELVIS:  1. Interval increase in size of the necrotic mass involving the tail of the pancreas with likely extension into the spleen at the hilum an likely involvement of the fundus of the stomach. Measurements are given above. 2. Interval development of numerous liver metastases. 3. Interval development of widespread omental metastatic disease. 4. Small amount of ascites in the pelvis. 5. Nonobstructing bilateral lower pole renal calculi. 6. Bilateral parapelvic renal cysts and approximate 5 cm cortical cyst arising from the right kidney. 7. These results were called by telephone at the time of interpretation on 11/22/2013 at 2:15 AM to Dr. Noemi Chapel , who verbally acknowledged these results.   Electronically Signed   By: Evangeline Dakin M.D.   On: 11/22/2013 02:17   Ct Abdomen Pelvis W Contrast  11/22/2013   CLINICAL DATA:  Recent diagnosis of pancreatic cancer in November, 2014, presenting with fever, epigastric abdominal pain, and left-sided chest pain. Surgical history includes cholecystectomy and hysterectomy.  EXAM: CT ANGIOGRAPHY CHEST  CT ABDOMEN AND PELVIS WITH CONTRAST  TECHNIQUE: Multidetector CT imaging of the chest was performed using the standard protocol during bolus administration of intravenous contrast. Multiplanar CT image reconstructions including MIPs were obtained to evaluate the vascular anatomy. Multidetector CT imaging of the abdomen and pelvis was performed using the standard protocol during bolus administration of intravenous contrast.  CONTRAST:  118m OMNIPAQUE  IOHEXOL 350 MG/ML IV. Oral contrast was not administered at the request of the emergency physician.  COMPARISON:  No prior CT  chest.  CT abdomen and pelvis 09/20/2013.  FINDINGS: CTA CHEST FINDINGS  Contrast opacification of the pulmonary arteries is good. Filling defects involving branches of the right lower lobe pulmonary artery. No filling defects identified elsewhere in either lung. No evidence of right heart strain. Heart mildly enlarged. No pericardial effusion. No visible coronary atherosclerosis. Mild atherosclerosis involving the thoracic aorta.  Large left pleural effusion. Dense consolidation with air bronchograms in the left lower lobe with a "drowned lung" appearance. Left lower lobe bronchi markedly narrowed by right hilar lymphadenopathy which will be detailed below. Central airways otherwise patent. No confluent airspace consolidation elsewhere in either lung. Multiple bilateral pulmonary nodules. Irregular, spiculated nodule in the right upper lobe measures approximately 1.7 x 1.4 x 1.4 cm. Minimal interstitial opacities in both lungs. No right pleural effusion. Mild atelectasis deep in the right lower lobe.  Extensive bilateral hilar and mediastinal lymphadenopathy. Index right hilar nodes approximate 3.3 x 2.2 cm and index left hilar nodes approximate 3.2 x 1.4 cm (series 5, image 40). Index subcarinal mediastinal lymph node approximates 2.2 x 3.7 cm (image 42). No axillary lymphadenopathy. Visualized thyroid gland unremarkable.  Bone window images demonstrate diffuse thoracic spondylosis but no evidence of osseous metastatic disease.  CT ABDOMEN and PELVIS FINDINGS  Necrotic mass involving the tail of the pancreas with extension to the splenic hilum and probable involvement of the spleen measures approximately 6.9 x 5.3 by 5.6 cm (series 16, image 15 and series 13, image 57), increased since the prior examination. The mass likely also involves the gastric fundus along its greater curvature. Numerous hepatic parenchymal metastases which were not present on the prior examination. Largest index lesion in the anterior  segment right lobe measures approximately 3.0 x 3.3 x 4.0 cm (series 16, image 7 and series 13, image 55). Large metastasis involving the lower pole of the spleen, with several smaller splenic metastases, also a new finding; the largest metastasis in the lower pole of the spleen measures approximately 4.6 x 3.3 x 2.1 cm. Interval development of splenic vein thrombosis.  Numerous normal sized lymph nodes in the retroperitoneum and mesentery, some of which have increased in size since the prior examination. Interval development of widespread omental metastatic disease throughout the abdomen and pelvis. Small amount of ascites in the pelvis.  Normal adrenal glands. Nonobstructing bilateral lower pole renal calculi, the largest stone approximating 7 mm in a lower pole calix of the left kidney. Multiple bilateral parapelvic renal cysts. Large cortical cyst arising from the mid right kidney approximating 5.3 x 4.4 cm. No obstructing ureteral calculi. Mild aortoiliac atherosclerosis.  Small bowel of normal caliber throughout, though some of the anterior small bowel loops may be tethered by the omental metastatic disease. Moderate stool burden throughout the colon which is normal in appearance.  Urinary bladder unremarkable. Uterus surgically absent. Numerous pelvic phleboliths.  Bone window images demonstrate degenerative changes throughout the lumbar spine, degenerative changes in the sacroiliac joints, and degenerative changes in the symphysis pubis.  Review of the MIP images confirms the above findings.  IMPRESSION: CHEST:  1. Acute pulmonary emboli involving segmental branches of the right lower lobe pulmonary artery. Clot burden is small. 2. Numerous pulmonary parenchymal metastases, the largest index lesion in the right upper lobe measured above. 3. Extensive bilateral hilar lymphadenopathy. Metastatic mediastinal lymphadenopathy. Index nodes are measured above. 4. Narrowing of  the left lower lobe bronchus by a the  left hilar lymphadenopathy, with postobstructive pneumonia involving the left lower lobe. 5. Large left pleural effusion.  ABDOMEN/PELVIS:  1. Interval increase in size of the necrotic mass involving the tail of the pancreas with likely extension into the spleen at the hilum an likely involvement of the fundus of the stomach. Measurements are given above. 2. Interval development of numerous liver metastases. 3. Interval development of widespread omental metastatic disease. 4. Small amount of ascites in the pelvis. 5. Nonobstructing bilateral lower pole renal calculi. 6. Bilateral parapelvic renal cysts and approximate 5 cm cortical cyst arising from the right kidney. 7. These results were called by telephone at the time of interpretation on 11/22/2013 at 2:15 AM to Dr. Noemi Chapel , who verbally acknowledged these results.   Electronically Signed   By: Evangeline Dakin M.D.   On: 11/22/2013 02:17    Assessment/Plan Active Problems:   Pulmonary embolism   Pneumonia  pancreatic cancer  Pulmonary embolism- patient has PE as seen on the CT chest. Patient has been started on Lovenox per pharmacy consult.  Pneumonia- likely post obstructive pneumonia, will start the patient on vancomycin and Zosyn.  Pancreatic cancer- CT abdomen shows liver metastasis, small amount of ascites in the pelvis, widespread omental metastatic disease. The cancer has progressed, we'll discuss with oncology regarding future options.  Code status: Patient is DO NOT RESUSCITATE  Family discussion: Discussed with patient and daughters at bedside   Time Spent on Admission: 60 minutes  Naudia Crosley S [facility] Pager: [phone] 11/22/2013, 4:03 AM  If 7PM-7AM, please contact night-coverage  [email or web address]  Password [email or web address]

## 2013-11-22 NOTE — Progress Notes (Signed)
ANTIBIOTIC CONSULT NOTE - INITIAL  Pharmacy Consult for Vancomycin and Zosyn Indication: pneumonia  Allergies  Allergen Reactions  . Aspirin     Makes my heart race    Patient Measurements: Height: 5' 6.5" (168.9 cm) Weight: 180 lb 8 oz (81.874 kg) IBW/kg (Calculated) : 60.45  Vital Signs: Temp: 98 F (36.7 C) (01/13 0610) Temp src: Oral (01/13 0610) BP: 97/63 mmHg (01/13 0610) Pulse Rate: 82 (01/13 0610) Intake/Output from previous day:   Intake/Output from this shift:    Labs:  Recent Labs  11/21/13 2301 11/22/13 0513  WBC 18.2* 14.0*  HGB 13.4 12.6  PLT 157 160  CREATININE 1.02 0.90   Estimated Creatinine Clearance: 57.1 ml/min (by C-G formula based on Cr of 0.9). No results found for this basename: VANCOTROUGH, Corlis Leak, VANCORANDOM, GENTTROUGH, GENTPEAK, GENTRANDOM, TOBRATROUGH, TOBRAPEAK, TOBRARND, AMIKACINPEAK, AMIKACINTROU, AMIKACIN,  in the last 72 hours   Microbiology: Recent Results (from the past 720 hour(s))  CULTURE, BLOOD (ROUTINE X 2)     Status: None   Collection Time    11/22/13  2:36 AM      Result Value Range Status   Specimen Description BLOOD RIGHT HAND   Final   Special Requests     Final   Value: BOTTLES DRAWN AEROBIC AND ANAEROBIC AEB 2CC ANA 3CC   Culture NO GROWTH <24 HRS   Final   Report Status PENDING   Incomplete  CULTURE, BLOOD (ROUTINE X 2)     Status: None   Collection Time    11/22/13  2:36 AM      Result Value Range Status   Specimen Description BLOOD LEFT HAND   Final   Special Requests BOTTLES DRAWN AEROBIC AND ANAEROBIC 2CC EACH   Final   Culture NO GROWTH <24 HRS   Final   Report Status PENDING   Incomplete   Medical History: Past Medical History  Diagnosis Date  . Hypertension    Medications:  Scheduled:  . sodium chloride   Intravenous STAT  . enoxaparin (LOVENOX) injection  80 mg Subcutaneous Q12H  . hydrochlorothiazide  12.5 mg Oral Daily  . irbesartan  300 mg Oral Daily  . latanoprost  1 drop Both  Eyes QHS  . nepafenac  1 drop Left Eye Daily  . OxyCODONE  10 mg Oral Q12H  . piperacillin-tazobactam (ZOSYN)  IV  3.375 g Intravenous Q8H  . prednisoLONE acetate  1 drop Left Eye Daily  . vancomycin  750 mg Intravenous Q12H   Assessment: 80yo female recently diagnosed with pancreatic cancer and has been sick for 2 weeks.  Pt found to have LL pneumonia and asked to initiate Vancomycin and Zosyn.  Estimated Creatinine Clearance: 57.1 ml/min (by C-G formula based on Cr of 0.9).  Goal of Therapy:  Vancomycin trough level 15-20 mcg/ml  Plan:  Vancomycin 750mg  IV q12hrs Check trough at steady state Zosyn 3.375gm IV q8h, each dose over 4 hrs Monitor labs, renal fxn, and cultures  Hart Robinsons A 11/22/2013,8:36 AM

## 2013-11-22 NOTE — Consult Note (Signed)
[facility] Consultation Oncology  Name: Ellen [name]      MRN: [id number]    Location: A311/A311-01  Date: 11/22/2013 Time:3:57 PM   REFERRING PHYSICIAN:  Marjean Donna, MD  REASON FOR CONSULT:  Widespread metastatic disease   DIAGNOSIS:  Widespread metastatic disease, likely pancreatic primary  HISTORY OF PRESENT ILLNESS:   Ellen [name] is a 80 year old Caucasian female who was referred to [facility] by Dr. Arnoldo Morale for a 5 cm tail of pancreas mass identified on CT scans in November 2014.  She was seen by Dr. Eugenia Pancoast as recently as 11/01/2013.  Dr. Birdie Sons planned to perform surgery and the date of anticipated surgery was scheduled for 11/18/2013.  The patient called [facility] and cancelled the surgical procedure due to "bronchitis."  The patient's chart is reviewed.  It is noted that back in November 2014, the patient underwent a CT abd/pelvis for left lower quadrant abdominal pain.  This was ordered by Dr. Everette Rank.  That CT scan revealed a 5 cm tail of pancreas mass worrisome for adenocarcinoma.  No other metastatic disease identified, but a 5 mm nodule in the right middle lobe of lung was identified, favored to be benign at that time.  According to the patient, she was referred to Dr. Arnoldo Morale (Gen Surg) who subsequently referred the patient to [facility].    Mende reported to the ED on 11/21/2013 with "pneumonia."  A CT angio of chest was performed on 11/22/2013 demonstrating an acute PE involving segmental branches of th right lower lobe pulmonary artery with small clot burden.  Additionally, numerous pulmonary parenchyma metastases, the largest in the right upper lobe, extensive bilateral hilar adenopathy, mediastinal adenopathy, large left pleural effusion, and left lower lobe postobstructive pneumonia.  CT Abd/pelvis performed on 11/22/2013 demonstrated an interval increase in size of necrotic mass in the tail of the pancreas with extension into the spleen and fundus of the  stomach, interval development of hepatic metastases, and development of omental metastatic disease.  I discussed the case with Dr. Eugenia Pancoast at [facility] and he is agreeable to forego surgery.    With surgery not being an option, and radiation not an option, chemotherapy is the only available modality of treatment.   Chemotherapy for pancreatic cancer is typically FOLFIRINOX which is an extremely toxic regimen.  Studies have shown that FOLFIRINOX improves overall survival and progression-free survival in pancreatic adenocarcinoma when compared to Gemcitabine based therapies.  However, FOLFIRINOX is too toxic of a regimen for this 80 year old with her tumor burden.  Salvage Gemcitabine would be the most tolerable regimen, but too toxic with this large of tumor burden.  As a result, chemotherapy is not an option for this patient.  I have broached the topic of Hospice. We had a long conversation regarding Hospice.  Patient education was given regarding Hospice and the services they provide.  Hospice will allow the patient to stay at home at end of life or go to a facility for end of life care.  At this point, the patient would like to remain at home.  Hospice provides the patient with a team of providers to help with care including physicians, nurses, aids, chaplains, and social workers.  Hospice's goal is to keep patients out of the hospital and and comfortable by controlling symptoms with medications.  The patient is certainly Hospice appropriate with a life expectancy of less than 6 months (likely 3 months or less).  The patient understands that she can be discharged from  Hospice at anytime.  She will ponder this option.  Family members are knowledgeable about Hospice services from a personal experience.   She denies any complaints today.  She was provided with a lot of unfortunate new and information today.  I encouraged continued spirituality and religion.  We will support her decision and provide her with  information so she can make the best decision for herself moving forward.  DNR noted.   PAST MEDICAL HISTORY:   Past Medical History  Diagnosis Date  . Hypertension     ALLERGIES: Allergies  Allergen Reactions  . Aspirin     Makes my heart race      MEDICATIONS: I have reviewed the patient's current medications.     PAST SURGICAL HISTORY Past Surgical History  Procedure Laterality Date  . Abdominal hysterectomy    . Cholecystectomy      FAMILY HISTORY: No family history on file.  SOCIAL HISTORY:  reports that she has never smoked. She does not have any smokeless tobacco history on file. She reports that she does not drink alcohol or use illicit drugs.  PERFORMANCE STATUS: The patient's performance status is 2 - Symptomatic, <50% confined to bed  PHYSICAL EXAM: Most Recent Vital Signs: Blood pressure 104/48, pulse 87, temperature 98.8 F (37.1 C), temperature source Oral, resp. rate 18, height 5' 6.5" (1.689 m), weight 180 lb 8 oz (81.874 kg), SpO2 93.00%. General appearance: alert, cooperative, appears older than stated age, no distress and mildly obese Head: Normocephalic, without obvious abnormality, atraumatic Eyes: negative Nose: atraumatic Neck: supple, symmetrical, trachea midline Pulses: regular radially Skin: Skin color, texture, turgor normal. No rashes or lesions Neurologic: Grossly normal  LABORATORY DATA:  Results for orders placed during the hospital encounter of 11/21/13 (from the past 48 hour(s))  CBC WITH DIFFERENTIAL     Status: Abnormal   Collection Time    11/21/13 11:01 PM      Result Value Range   WBC 18.2 (*) 4.0 - 10.5 K/uL   RBC 4.44  3.87 - 5.11 MIL/uL   Hemoglobin 13.4  12.0 - 15.0 g/dL   HCT 39.1  36.0 - 46.0 %   MCV 88.1  78.0 - 100.0 fL   MCH 30.2  26.0 - 34.0 pg   MCHC 34.3  30.0 - 36.0 g/dL   RDW 13.6  11.5 - 15.5 %   Platelets 157  150 - 400 K/uL   Neutrophils Relative % 77  43 - 77 %   Neutro Abs 14.0 (*) 1.7 - 7.7 K/uL    Lymphocytes Relative 7 (*) 12 - 46 %   Lymphs Abs 1.4  0.7 - 4.0 K/uL   Monocytes Relative 11  3 - 12 %   Monocytes Absolute 2.0 (*) 0.1 - 1.0 K/uL   Eosinophils Relative 5  0 - 5 %   Eosinophils Absolute 0.9 (*) 0.0 - 0.7 K/uL   Basophils Relative 0  0 - 1 %   Basophils Absolute 0.0  0.0 - 0.1 K/uL  BASIC METABOLIC PANEL     Status: Abnormal   Collection Time    11/21/13 11:01 PM      Result Value Range   Sodium 134 (*) 137 - 147 mEq/L   Potassium 4.1  3.7 - 5.3 mEq/L   Chloride 93 (*) 96 - 112 mEq/L   CO2 24  19 - 32 mEq/L   Glucose, Bld 141 (*) 70 - 99 mg/dL   BUN 24 (*) 6 - 23 mg/dL  Creatinine, Ser 1.02  0.50 - 1.10 mg/dL   Calcium 9.9  8.4 - 10.5 mg/dL   GFR calc non Af Amer 52 (*) >90 mL/min   GFR calc Af Amer 60 (*) >90 mL/min   Comment: (NOTE)     The eGFR has been calculated using the CKD EPI equation.     This calculation has not been validated in all clinical situations.     eGFR's persistently <90 mL/min signify possible Chronic Kidney     Disease.  INFLUENZA PANEL BY PCR (TYPE A & B, H1N1)     Status: None   Collection Time    11/21/13 11:20 PM      Result Value Range   Influenza A By PCR NEGATIVE  NEGATIVE   Influenza B By PCR NEGATIVE  NEGATIVE   H1N1 flu by pcr NOT DETECTED  NOT DETECTED   Comment:            The Xpert Flu assay (FDA approved for     nasal aspirates or washes and     nasopharyngeal swab specimens), is     intended as an aid in the diagnosis of     influenza and should not be used as     a sole basis for treatment.  LACTIC ACID, PLASMA     Status: None   Collection Time    11/22/13  2:36 AM      Result Value Range   Lactic Acid, Venous 2.1  0.5 - 2.2 mmol/L  CULTURE, BLOOD (ROUTINE X 2)     Status: None   Collection Time    11/22/13  2:36 AM      Result Value Range   Specimen Description BLOOD RIGHT HAND     Special Requests       Value: BOTTLES DRAWN AEROBIC AND ANAEROBIC AEB 2CC ANA 3CC   Culture NO GROWTH <24 HRS     Report  Status PENDING    CULTURE, BLOOD (ROUTINE X 2)     Status: None   Collection Time    11/22/13  2:36 AM      Result Value Range   Specimen Description BLOOD LEFT HAND     Special Requests BOTTLES DRAWN AEROBIC AND ANAEROBIC 2CC EACH     Culture NO GROWTH <24 HRS     Report Status PENDING    CBC     Status: Abnormal   Collection Time    11/22/13  5:13 AM      Result Value Range   WBC 14.0 (*) 4.0 - 10.5 K/uL   RBC 4.17  3.87 - 5.11 MIL/uL   Hemoglobin 12.6  12.0 - 15.0 g/dL   HCT 37.0  36.0 - 46.0 %   MCV 88.7  78.0 - 100.0 fL   MCH 30.2  26.0 - 34.0 pg   MCHC 34.1  30.0 - 36.0 g/dL   RDW 14.0  11.5 - 15.5 %   Platelets 160  150 - 400 K/uL  COMPREHENSIVE METABOLIC PANEL     Status: Abnormal   Collection Time    11/22/13  5:13 AM      Result Value Range   Sodium 136 (*) 137 - 147 mEq/L   Potassium 3.4 (*) 3.7 - 5.3 mEq/L   Comment: DELTA CHECK NOTED   Chloride 96  96 - 112 mEq/L   CO2 22  19 - 32 mEq/L   Glucose, Bld 121 (*) 70 - 99 mg/dL   BUN 20  6 - 23 mg/dL   Creatinine, Ser 0.90  0.50 - 1.10 mg/dL   Calcium 9.2  8.4 - 10.5 mg/dL   Total Protein 6.8  6.0 - 8.3 g/dL   Albumin 2.3 (*) 3.5 - 5.2 g/dL   AST 27  0 - 37 U/L   ALT 20  0 - 35 U/L   Alkaline Phosphatase 121 (*) 39 - 117 U/L   Total Bilirubin 0.8  0.3 - 1.2 mg/dL   GFR calc non Af Amer 60 (*) >90 mL/min   GFR calc Af Amer 70 (*) >90 mL/min   Comment: (NOTE)     The eGFR has been calculated using the CKD EPI equation.     This calculation has not been validated in all clinical situations.     eGFR's persistently <90 mL/min signify possible Chronic Kidney     Disease.      RADIOGRAPHY: Dg Chest 2 View  11/21/2013   CLINICAL DATA:  Cough, congestion, shortness of breath  EXAM: CHEST  2 VIEW  COMPARISON:  DG CHEST 2 VIEW dated 11/11/2013; CT ABD - PELV W/ CM dated 09/20/2013  FINDINGS: There is a small left pleural effusion. There is left basilar airspace disease concerning for pneumonia. The right lung is clear.  There is no pneumothorax. Normal cardiomediastinal silhouette. Unremarkable osseous structures.  IMPRESSION: Left lower lobe pneumonia. Small parapneumonic effusion. Given the persistent abnormality further evaluation with a CT of the chest is recommended.   Electronically Signed   By: Kathreen Devoid   On: 11/21/2013 11:06   Ct Angio Chest Pe W/cm &/or Wo Cm  11/22/2013   CLINICAL DATA:  Recent diagnosis of pancreatic cancer in November, 2014, presenting with fever, epigastric abdominal pain, and left-sided chest pain. Surgical history includes cholecystectomy and hysterectomy.  EXAM: CT ANGIOGRAPHY CHEST  CT ABDOMEN AND PELVIS WITH CONTRAST  TECHNIQUE: Multidetector CT imaging of the chest was performed using the standard protocol during bolus administration of intravenous contrast. Multiplanar CT image reconstructions including MIPs were obtained to evaluate the vascular anatomy. Multidetector CT imaging of the abdomen and pelvis was performed using the standard protocol during bolus administration of intravenous contrast.  CONTRAST:  142m OMNIPAQUE IOHEXOL 350 MG/ML IV. Oral contrast was not administered at the request of the emergency physician.  COMPARISON:  No prior CT chest.  CT abdomen and pelvis 09/20/2013.  FINDINGS: CTA CHEST FINDINGS  Contrast opacification of the pulmonary arteries is good. Filling defects involving branches of the right lower lobe pulmonary artery. No filling defects identified elsewhere in either lung. No evidence of right heart strain. Heart mildly enlarged. No pericardial effusion. No visible coronary atherosclerosis. Mild atherosclerosis involving the thoracic aorta.  Large left pleural effusion. Dense consolidation with air bronchograms in the left lower lobe with a "drowned lung" appearance. Left lower lobe bronchi markedly narrowed by right hilar lymphadenopathy which will be detailed below. Central airways otherwise patent. No confluent airspace consolidation elsewhere in  either lung. Multiple bilateral pulmonary nodules. Irregular, spiculated nodule in the right upper lobe measures approximately 1.7 x 1.4 x 1.4 cm. Minimal interstitial opacities in both lungs. No right pleural effusion. Mild atelectasis deep in the right lower lobe.  Extensive bilateral hilar and mediastinal lymphadenopathy. Index right hilar nodes approximate 3.3 x 2.2 cm and index left hilar nodes approximate 3.2 x 1.4 cm (series 5, image 40). Index subcarinal mediastinal lymph node approximates 2.2 x 3.7 cm (image 42). No axillary lymphadenopathy. Visualized thyroid gland unremarkable.  Bone window  images demonstrate diffuse thoracic spondylosis but no evidence of osseous metastatic disease.  CT ABDOMEN and PELVIS FINDINGS  Necrotic mass involving the tail of the pancreas with extension to the splenic hilum and probable involvement of the spleen measures approximately 6.9 x 5.3 by 5.6 cm (series 16, image 15 and series 13, image 57), increased since the prior examination. The mass likely also involves the gastric fundus along its greater curvature. Numerous hepatic parenchymal metastases which were not present on the prior examination. Largest index lesion in the anterior segment right lobe measures approximately 3.0 x 3.3 x 4.0 cm (series 16, image 7 and series 13, image 55). Large metastasis involving the lower pole of the spleen, with several smaller splenic metastases, also a new finding; the largest metastasis in the lower pole of the spleen measures approximately 4.6 x 3.3 x 2.1 cm. Interval development of splenic vein thrombosis.  Numerous normal sized lymph nodes in the retroperitoneum and mesentery, some of which have increased in size since the prior examination. Interval development of widespread omental metastatic disease throughout the abdomen and pelvis. Small amount of ascites in the pelvis.  Normal adrenal glands. Nonobstructing bilateral lower pole renal calculi, the largest stone approximating 7  mm in a lower pole calix of the left kidney. Multiple bilateral parapelvic renal cysts. Large cortical cyst arising from the mid right kidney approximating 5.3 x 4.4 cm. No obstructing ureteral calculi. Mild aortoiliac atherosclerosis.  Small bowel of normal caliber throughout, though some of the anterior small bowel loops may be tethered by the omental metastatic disease. Moderate stool burden throughout the colon which is normal in appearance.  Urinary bladder unremarkable. Uterus surgically absent. Numerous pelvic phleboliths.  Bone window images demonstrate degenerative changes throughout the lumbar spine, degenerative changes in the sacroiliac joints, and degenerative changes in the symphysis pubis.  Review of the MIP images confirms the above findings.  IMPRESSION: CHEST:  1. Acute pulmonary emboli involving segmental branches of the right lower lobe pulmonary artery. Clot burden is small. 2. Numerous pulmonary parenchymal metastases, the largest index lesion in the right upper lobe measured above. 3. Extensive bilateral hilar lymphadenopathy. Metastatic mediastinal lymphadenopathy. Index nodes are measured above. 4. Narrowing of the left lower lobe bronchus by a the left hilar lymphadenopathy, with postobstructive pneumonia involving the left lower lobe. 5. Large left pleural effusion.  ABDOMEN/PELVIS:  1. Interval increase in size of the necrotic mass involving the tail of the pancreas with likely extension into the spleen at the hilum an likely involvement of the fundus of the stomach. Measurements are given above. 2. Interval development of numerous liver metastases. 3. Interval development of widespread omental metastatic disease. 4. Small amount of ascites in the pelvis. 5. Nonobstructing bilateral lower pole renal calculi. 6. Bilateral parapelvic renal cysts and approximate 5 cm cortical cyst arising from the right kidney. 7. These results were called by telephone at the time of interpretation on  11/22/2013 at 2:15 AM to Dr. Noemi Chapel , who verbally acknowledged these results.   Electronically Signed   By: Evangeline Dakin M.D.   On: 11/22/2013 02:17   Ct Abdomen Pelvis W Contrast  11/22/2013   CLINICAL DATA:  Recent diagnosis of pancreatic cancer in November, 2014, presenting with fever, epigastric abdominal pain, and left-sided chest pain. Surgical history includes cholecystectomy and hysterectomy.  EXAM: CT ANGIOGRAPHY CHEST  CT ABDOMEN AND PELVIS WITH CONTRAST  TECHNIQUE: Multidetector CT imaging of the chest was performed using the standard protocol during bolus administration of  intravenous contrast. Multiplanar CT image reconstructions including MIPs were obtained to evaluate the vascular anatomy. Multidetector CT imaging of the abdomen and pelvis was performed using the standard protocol during bolus administration of intravenous contrast.  CONTRAST:  177m OMNIPAQUE IOHEXOL 350 MG/ML IV. Oral contrast was not administered at the request of the emergency physician.  COMPARISON:  No prior CT chest.  CT abdomen and pelvis 09/20/2013.  FINDINGS: CTA CHEST FINDINGS  Contrast opacification of the pulmonary arteries is good. Filling defects involving branches of the right lower lobe pulmonary artery. No filling defects identified elsewhere in either lung. No evidence of right heart strain. Heart mildly enlarged. No pericardial effusion. No visible coronary atherosclerosis. Mild atherosclerosis involving the thoracic aorta.  Large left pleural effusion. Dense consolidation with air bronchograms in the left lower lobe with a "drowned lung" appearance. Left lower lobe bronchi markedly narrowed by right hilar lymphadenopathy which will be detailed below. Central airways otherwise patent. No confluent airspace consolidation elsewhere in either lung. Multiple bilateral pulmonary nodules. Irregular, spiculated nodule in the right upper lobe measures approximately 1.7 x 1.4 x 1.4 cm. Minimal interstitial  opacities in both lungs. No right pleural effusion. Mild atelectasis deep in the right lower lobe.  Extensive bilateral hilar and mediastinal lymphadenopathy. Index right hilar nodes approximate 3.3 x 2.2 cm and index left hilar nodes approximate 3.2 x 1.4 cm (series 5, image 40). Index subcarinal mediastinal lymph node approximates 2.2 x 3.7 cm (image 42). No axillary lymphadenopathy. Visualized thyroid gland unremarkable.  Bone window images demonstrate diffuse thoracic spondylosis but no evidence of osseous metastatic disease.  CT ABDOMEN and PELVIS FINDINGS  Necrotic mass involving the tail of the pancreas with extension to the splenic hilum and probable involvement of the spleen measures approximately 6.9 x 5.3 by 5.6 cm (series 16, image 15 and series 13, image 57), increased since the prior examination. The mass likely also involves the gastric fundus along its greater curvature. Numerous hepatic parenchymal metastases which were not present on the prior examination. Largest index lesion in the anterior segment right lobe measures approximately 3.0 x 3.3 x 4.0 cm (series 16, image 7 and series 13, image 55). Large metastasis involving the lower pole of the spleen, with several smaller splenic metastases, also a new finding; the largest metastasis in the lower pole of the spleen measures approximately 4.6 x 3.3 x 2.1 cm. Interval development of splenic vein thrombosis.  Numerous normal sized lymph nodes in the retroperitoneum and mesentery, some of which have increased in size since the prior examination. Interval development of widespread omental metastatic disease throughout the abdomen and pelvis. Small amount of ascites in the pelvis.  Normal adrenal glands. Nonobstructing bilateral lower pole renal calculi, the largest stone approximating 7 mm in a lower pole calix of the left kidney. Multiple bilateral parapelvic renal cysts. Large cortical cyst arising from the mid right kidney approximating 5.3 x 4.4  cm. No obstructing ureteral calculi. Mild aortoiliac atherosclerosis.  Small bowel of normal caliber throughout, though some of the anterior small bowel loops may be tethered by the omental metastatic disease. Moderate stool burden throughout the colon which is normal in appearance.  Urinary bladder unremarkable. Uterus surgically absent. Numerous pelvic phleboliths.  Bone window images demonstrate degenerative changes throughout the lumbar spine, degenerative changes in the sacroiliac joints, and degenerative changes in the symphysis pubis.  Review of the MIP images confirms the above findings.  IMPRESSION: CHEST:  1. Acute pulmonary emboli involving segmental branches of the right  lower lobe pulmonary artery. Clot burden is small. 2. Numerous pulmonary parenchymal metastases, the largest index lesion in the right upper lobe measured above. 3. Extensive bilateral hilar lymphadenopathy. Metastatic mediastinal lymphadenopathy. Index nodes are measured above. 4. Narrowing of the left lower lobe bronchus by a the left hilar lymphadenopathy, with postobstructive pneumonia involving the left lower lobe. 5. Large left pleural effusion.  ABDOMEN/PELVIS:  1. Interval increase in size of the necrotic mass involving the tail of the pancreas with likely extension into the spleen at the hilum an likely involvement of the fundus of the stomach. Measurements are given above. 2. Interval development of numerous liver metastases. 3. Interval development of widespread omental metastatic disease. 4. Small amount of ascites in the pelvis. 5. Nonobstructing bilateral lower pole renal calculi. 6. Bilateral parapelvic renal cysts and approximate 5 cm cortical cyst arising from the right kidney. 7. These results were called by telephone at the time of interpretation on 11/22/2013 at 2:15 AM to Dr. Noemi Chapel , who verbally acknowledged these results.   Electronically Signed   By: Evangeline Dakin M.D.   On: 11/22/2013 02:17        ASSESSMENT:  1. Metastatic disease, suspect adenocarcinoma of pancreas primary beginning with a 5 cm tail of pancreas mass in November 2014.  Now with widespread metastatic disease with pancreas lesion invading into spleen and stomach, hepatic metastases, pulmonary metastases, lymphadenopathy, left pleural effusion, ascites, and omental metastases.  Significant progression over 2 months span.   2. Elevated CA 19-9 3. Pulmonary embolism, small clot burden.  On Lovenox per pharmacy protocol. 4. DNR 5. Terminal prognosis   PLAN:  1. I personally reviewed and went over laboratory results with the patient. 2. I personally reviewed and went over radiographic studies with the patient. 3. Reviewed the images with grandson 4. Discussed case with Dr. Franki Cabot at [facility].  Surgery not an option due to metastatic disease. 5. Patient on Lovenox.  Recommend continuation of Lovenox at this time. 6. Due to patients tumor burden and co morbidities, she is not a candidate for chemotherapy.  She has a life expectancy of less than 6 months and I suspect a better estimation of 3 months.   7. From an oncology perspective, we recommend referral to Hospice.   8. Hospice broached.  She wishes to consider this option and she will update the treatment team in the future regarding this decision. 9 Will follow along as an inpatient.  All questions were answered. The patient knows to call the clinic with any problems, questions or concerns. We can certainly see the patient much sooner if necessary.  Patient and plan discussed with Dr. Farrel Gobble and he is in agreement with the aforementioned.    Cashus Halterman

## 2013-11-22 NOTE — Consult Note (Signed)
Consult requested by: Dr. Renard MatterMcInnis Consult requested for pulmonary embolism:  HPI: This is a 80 year old Caucasian female who has recently been discovered to have pancreatic cancer. She had [facility] had recurrent problems with cough and congestion and the surgery has had to be postponed. She had increasing shortness of breath and came to the emergency department today where she was found to have pulmonary embolus on chest CT. However she was also found to have what appeared to be an obstructive pneumonia left pleural effusion, likely pulmonary metastatic disease widespread intra-abdominal metastatic disease.  Past Medical History  Diagnosis Date  . Hypertension      No family history on file.   History   Social History  . Marital Status: Widowed    Spouse Name: N/A    Number of Children: N/A  . Years of Education: N/A   Social History Main Topics  . Smoking status: Never Smoker   . Smokeless tobacco: None  . Alcohol Use: No  . Drug Use: No  . Sexual Activity: Yes    Birth Control/ Protection: Surgical   Other Topics Concern  . None   Social History Narrative  . None     ROS: She has not lost weight. She has had a poor appetite. She has been coughing. She denies chest pain. She has not had hemoptysis.    Objective: Vital signs in last 24 hours: Temp:  [98 F (36.7 C)-101 F (38.3 C)] 98 F (36.7 C) (01/13 0610) Pulse Rate:  [82-118] 82 (01/13 0610) Resp:  [16-18] 18 (01/13 0610) BP: (97-114)/(57-63) 97/63 mmHg (01/13 0610) SpO2:  [94 %] 94 % (01/13 0610) Weight:  [81.874 kg (180 lb 8 oz)-83.915 kg (185 lb)] 81.874 kg (180 lb 8 oz) (01/13 0610) Weight change:  Last BM Date: 11/17/13  Intake/Output from previous day:    PHYSICAL EXAM She is awake and alert. She says she feels better since she's received some fluids. She is much less short of breath. Her pupils react. Nose and throat are clear. Her neck is supple  without masses. Her chest shows some diminished breath sounds on the left and bilateral rhonchi. Her heart is regular without gallop. Her abdomen is soft I don't feel a mass. Extremities showed no edema. Central nervous system exam is grossly intact  Lab Results: Basic Metabolic Panel:  Recent Labs  40/98/1100/10/24 2301 11/22/13 0513  NA 134* 136*  K 4.1 3.4*  CL 93* 96  CO2 24 22  GLUCOSE 141* 121*  BUN 24* 20  CREATININE 1.02 0.90  CALCIUM 9.9 9.2   Liver Function Tests:  Recent Labs  11/22/13 0513  AST 27  ALT 20  ALKPHOS 121*  BILITOT 0.8  PROT 6.8  ALBUMIN 2.3*   No results found for this basename: LIPASE, AMYLASE,  in the last 72 hours No results found for this basename: AMMONIA,  in the last 72 hours CBC:  Recent Labs  11/21/13 2301 11/22/13 0513  WBC 18.2* 14.0*  NEUTROABS 14.0*  --   HGB 13.4 12.6  HCT 39.1 37.0  MCV 88.1 88.7  PLT 157 160   Cardiac Enzymes: No results found for this basename: CKTOTAL, CKMB, CKMBINDEX, TROPONINI,  in the last 72 hours BNP: No results found for this basename: PROBNP,  in the last 72 hours D-Dimer: No results found for this basename: DDIMER,  in the last 72 hours CBG: No results found for this basename: GLUCAP,  in the last  72 hours Hemoglobin A1C: No results found for this basename: HGBA1C,  in the last 72 hours Fasting Lipid Panel: No results found for this basename: CHOL, HDL, LDLCALC, TRIG, CHOLHDL, LDLDIRECT,  in the last 72 hours Thyroid Function Tests: No results found for this basename: TSH, T4TOTAL, FREET4, T3FREE, THYROIDAB,  in the last 72 hours Anemia Panel: No results found for this basename: VITAMINB12, FOLATE, FERRITIN, TIBC, IRON, RETICCTPCT,  in the last 72 hours Coagulation: No results found for this basename: LABPROT, INR,  in the last 72 hours Urine Drug Screen: Drugs of Abuse  No results found for this basename: labopia, cocainscrnur, labbenz, amphetmu, thcu, labbarb    Alcohol Level: No results  found for this basename: ETH,  in the last 72 hours Urinalysis: No results found for this basename: COLORURINE, APPERANCEUR, LABSPEC, PHURINE, GLUCOSEU, HGBUR, BILIRUBINUR, KETONESUR, PROTEINUR, UROBILINOGEN, NITRITE, LEUKOCYTESUR,  in the last 72 hours Misc. Labs:   ABGS: No results found for this basename: PHART, PCO2, PO2ART, TCO2, HCO3,  in the last 72 hours   MICROBIOLOGY: Recent Results (from the past 240 hour(s))  CULTURE, BLOOD (ROUTINE X 2)     Status: None   Collection Time    11/22/13  2:36 AM      Result Value Range Status   Specimen Description BLOOD RIGHT HAND   Final   Special Requests     Final   Value: BOTTLES DRAWN AEROBIC AND ANAEROBIC AEB 2CC ANA 3CC   Culture NO GROWTH <24 HRS   Final   Report Status PENDING   Incomplete  CULTURE, BLOOD (ROUTINE X 2)     Status: None   Collection Time    11/22/13  2:36 AM      Result Value Range Status   Specimen Description BLOOD LEFT HAND   Final   Special Requests BOTTLES DRAWN AEROBIC AND ANAEROBIC 2CC EACH   Final   Culture NO GROWTH <24 HRS   Final   Report Status PENDING   Incomplete    Studies/Results: Dg Chest 2 View  11/21/2013   CLINICAL DATA:  Cough, congestion, shortness of breath  EXAM: CHEST  2 VIEW  COMPARISON:  DG CHEST 2 VIEW dated 11/11/2013; CT ABD - PELV W/ CM dated 09/20/2013  FINDINGS: There is a small left pleural effusion. There is left basilar airspace disease concerning for pneumonia. The right lung is clear. There is no pneumothorax. Normal cardiomediastinal silhouette. Unremarkable osseous structures.  IMPRESSION: Left lower lobe pneumonia. Small parapneumonic effusion. Given the persistent abnormality further evaluation with a CT of the chest is recommended.   Electronically Signed   By: Elige Ko   On: 11/21/2013 11:06   Ct Angio Chest Pe W/cm &/or Wo Cm  11/22/2013   CLINICAL DATA:  Recent diagnosis of pancreatic cancer in November, 2014, presenting with fever, epigastric abdominal pain, and  left-sided chest pain. Surgical history includes cholecystectomy and hysterectomy.  EXAM: CT ANGIOGRAPHY CHEST  CT ABDOMEN AND PELVIS WITH CONTRAST  TECHNIQUE: Multidetector CT imaging of the chest was performed using the standard protocol during bolus administration of intravenous contrast. Multiplanar CT image reconstructions including MIPs were obtained to evaluate the vascular anatomy. Multidetector CT imaging of the abdomen and pelvis was performed using the standard protocol during bolus administration of intravenous contrast.  CONTRAST:  OMNIPAQUE IOHEXOL 350 MG/ML IV. Oral contrast was not administered at the request of the emergency physician.  COMPARISON:  No prior CT chest.  CT abdomen and pelvis 09/20/2013.  FINDINGS:  CTA CHEST FINDINGS  Contrast opacification of the pulmonary arteries is good. Filling defects involving branches of the right lower lobe pulmonary artery. No filling defects identified elsewhere in either lung. No evidence of right heart strain. Heart mildly enlarged. No pericardial effusion. No visible coronary atherosclerosis. Mild atherosclerosis involving the thoracic aorta.  Large left pleural effusion. Dense consolidation with air bronchograms in the left lower lobe with a "drowned lung" appearance. Left lower lobe bronchi markedly narrowed by right hilar lymphadenopathy which will be detailed below. Central airways otherwise patent. No confluent airspace consolidation elsewhere in either lung. Multiple bilateral pulmonary nodules. Irregular, spiculated nodule in the right upper lobe measures approximately 1.7 x 1.4 x 1.4 cm. Minimal interstitial opacities in both lungs. No right pleural effusion. Mild atelectasis deep in the right lower lobe.  Extensive bilateral hilar and mediastinal lymphadenopathy. Index right hilar nodes approximate 3.3 x 2.2 cm and index left hilar nodes approximate 3.2 x 1.4 cm (series 5, image 40). Index subcarinal mediastinal lymph node approximates 2.2  x 3.7 cm (image 42). No axillary lymphadenopathy. Visualized thyroid gland unremarkable.  Bone window images demonstrate diffuse thoracic spondylosis but no evidence of osseous metastatic disease.  CT ABDOMEN and PELVIS FINDINGS  Necrotic mass involving the tail of the pancreas with extension to the splenic hilum and probable involvement of the spleen measures approximately 6.9 x 5.3 by 5.6 cm (series 16, image 15 and series 13, image 57), increased since the prior examination. The mass likely also involves the gastric fundus along its greater curvature. Numerous hepatic parenchymal metastases which were not present on the prior examination. Largest index lesion in the anterior segment right lobe measures approximately 3.0 x 3.3 x 4.0 cm (series 16, image 7 and series 13, image 55). Large metastasis involving the lower pole of the spleen, with several smaller splenic metastases, also a new finding; the largest metastasis in the lower pole of the spleen measures approximately 4.6 x 3.3 x 2.1 cm. Interval development of splenic vein thrombosis.  Numerous normal sized lymph nodes in the retroperitoneum and mesentery, some of which have increased in size since the prior examination. Interval development of widespread omental metastatic disease throughout the abdomen and pelvis. Small amount of ascites in the pelvis.  Normal adrenal glands. Nonobstructing bilateral lower pole renal calculi, the largest stone approximating 7 mm in a lower pole calix of the left kidney. Multiple bilateral parapelvic renal cysts. Large cortical cyst arising from the mid right kidney approximating 5.3 x 4.4 cm. No obstructing ureteral calculi. Mild aortoiliac atherosclerosis.  Small bowel of normal caliber throughout, though some of the anterior small bowel loops may be tethered by the omental metastatic disease. Moderate stool burden throughout the colon which is normal in appearance.  Urinary bladder unremarkable. Uterus surgically absent.  Numerous pelvic phleboliths.  Bone window images demonstrate degenerative changes throughout the lumbar spine, degenerative changes in the sacroiliac joints, and degenerative changes in the symphysis pubis.  Review of the MIP images confirms the above findings.  IMPRESSION: CHEST:  1. Acute pulmonary emboli involving segmental branches of the right lower lobe pulmonary artery. Clot burden is small. 2. Numerous pulmonary parenchymal metastases, the largest index lesion in the right upper lobe measured above. 3. Extensive bilateral hilar lymphadenopathy. Metastatic mediastinal lymphadenopathy. Index nodes are measured above. 4. Narrowing of the left lower lobe bronchus by a the left hilar lymphadenopathy, with postobstructive pneumonia involving the left lower lobe. 5. Large left pleural effusion.  ABDOMEN/PELVIS:  1. Interval increase in  size of the necrotic mass involving the tail of the pancreas with likely extension into the spleen at the hilum an likely involvement of the fundus of the stomach. Measurements are given above. 2. Interval development of numerous liver metastases. 3. Interval development of widespread omental metastatic disease. 4. Small amount of ascites in the pelvis. 5. Nonobstructing bilateral lower pole renal calculi. 6. Bilateral parapelvic renal cysts and approximate 5 cm cortical cyst arising from the right kidney. 7. These results were called by telephone at the time of interpretation on 11/22/2013 at 2:15 AM to Dr. Eber Hong , who verbally acknowledged these results.   Electronically Signed   By: Hulan Saas M.D.   On: 11/22/2013 02:17   Ct Abdomen Pelvis W Contrast  11/22/2013   CLINICAL DATA:  Recent diagnosis of pancreatic cancer in November, 2014, presenting with fever, epigastric abdominal pain, and left-sided chest pain. Surgical history includes cholecystectomy and hysterectomy.  EXAM: CT ANGIOGRAPHY CHEST  CT ABDOMEN AND PELVIS WITH CONTRAST  TECHNIQUE: Multidetector CT  imaging of the chest was performed using the standard protocol during bolus administration of intravenous contrast. Multiplanar CT image reconstructions including MIPs were obtained to evaluate the vascular anatomy. Multidetector CT imaging of the abdomen and pelvis was performed using the standard protocol during bolus administration of intravenous contrast.  CONTRAST:  OMNIPAQUE IOHEXOL 350 MG/ML IV. Oral contrast was not administered at the request of the emergency physician.  COMPARISON:  No prior CT chest.  CT abdomen and pelvis 09/20/2013.  FINDINGS: CTA CHEST FINDINGS  Contrast opacification of the pulmonary arteries is good. Filling defects involving branches of the right lower lobe pulmonary artery. No filling defects identified elsewhere in either lung. No evidence of right heart strain. Heart mildly enlarged. No pericardial effusion. No visible coronary atherosclerosis. Mild atherosclerosis involving the thoracic aorta.  Large left pleural effusion. Dense consolidation with air bronchograms in the left lower lobe with a "drowned lung" appearance. Left lower lobe bronchi markedly narrowed by right hilar lymphadenopathy which will be detailed below. Central airways otherwise patent. No confluent airspace consolidation elsewhere in either lung. Multiple bilateral pulmonary nodules. Irregular, spiculated nodule in the right upper lobe measures approximately 1.7 x 1.4 x 1.4 cm. Minimal interstitial opacities in both lungs. No right pleural effusion. Mild atelectasis deep in the right lower lobe.  Extensive bilateral hilar and mediastinal lymphadenopathy. Index right hilar nodes approximate 3.3 x 2.2 cm and index left hilar nodes approximate 3.2 x 1.4 cm (series 5, image 40). Index subcarinal mediastinal lymph node approximates 2.2 x 3.7 cm (image 42). No axillary lymphadenopathy. Visualized thyroid gland unremarkable.  Bone window images demonstrate diffuse thoracic spondylosis but no evidence of osseous  metastatic disease.  CT ABDOMEN and PELVIS FINDINGS  Necrotic mass involving the tail of the pancreas with extension to the splenic hilum and probable involvement of the spleen measures approximately 6.9 x 5.3 by 5.6 cm (series 16, image 15 and series 13, image 57), increased since the prior examination. The mass likely also involves the gastric fundus along its greater curvature. Numerous hepatic parenchymal metastases which were not present on the prior examination. Largest index lesion in the anterior segment right lobe measures approximately 3.0 x 3.3 x 4.0 cm (series 16, image 7 and series 13, image 55). Large metastasis involving the lower pole of the spleen, with several smaller splenic metastases, also a new finding; the largest metastasis in the lower pole of the spleen measures approximately 4.6 x 3.3 x 2.1 cm.  Interval development of splenic vein thrombosis.  Numerous normal sized lymph nodes in the retroperitoneum and mesentery, some of which have increased in size since the prior examination. Interval development of widespread omental metastatic disease throughout the abdomen and pelvis. Small amount of ascites in the pelvis.  Normal adrenal glands. Nonobstructing bilateral lower pole renal calculi, the largest stone approximating 7 mm in a lower pole calix of the left kidney. Multiple bilateral parapelvic renal cysts. Large cortical cyst arising from the mid right kidney approximating 5.3 x 4.4 cm. No obstructing ureteral calculi. Mild aortoiliac atherosclerosis.  Small bowel of normal caliber throughout, though some of the anterior small bowel loops may be tethered by the omental metastatic disease. Moderate stool burden throughout the colon which is normal in appearance.  Urinary bladder unremarkable. Uterus surgically absent. Numerous pelvic phleboliths.  Bone window images demonstrate degenerative changes throughout the lumbar spine, degenerative changes in the sacroiliac joints, and degenerative  changes in the symphysis pubis.  Review of the MIP images confirms the above findings.  IMPRESSION: CHEST:  1. Acute pulmonary emboli involving segmental branches of the right lower lobe pulmonary artery. Clot burden is small. 2. Numerous pulmonary parenchymal metastases, the largest index lesion in the right upper lobe measured above. 3. Extensive bilateral hilar lymphadenopathy. Metastatic mediastinal lymphadenopathy. Index nodes are measured above. 4. Narrowing of the left lower lobe bronchus by a the left hilar lymphadenopathy, with postobstructive pneumonia involving the left lower lobe. 5. Large left pleural effusion.  ABDOMEN/PELVIS:  1. Interval increase in size of the necrotic mass involving the tail of the pancreas with likely extension into the spleen at the hilum an likely involvement of the fundus of the stomach. Measurements are given above. 2. Interval development of numerous liver metastases. 3. Interval development of widespread omental metastatic disease. 4. Small amount of ascites in the pelvis. 5. Nonobstructing bilateral lower pole renal calculi. 6. Bilateral parapelvic renal cysts and approximate 5 cm cortical cyst arising from the right kidney. 7. These results were called by telephone at the time of interpretation on 11/22/2013 at 2:15 AM to Dr. Eber Hong , who verbally acknowledged these results.   Electronically Signed   By: Hulan Saas M.D.   On: 11/22/2013 02:17    Medications:  Prior to Admission:  Prescriptions prior to admission  Medication Sig Dispense Refill  . bimatoprost (LUMIGAN) 0.03 % ophthalmic solution Place 1 drop into both eyes at bedtime.      . Difluprednate (DUREZOL) 0.05 % EMUL Place 1 drop into the left eye daily.      Marland Kitchen HYDROcodone-acetaminophen (NORCO/VICODIN) 5-325 MG per tablet Take 1-2 tablets by mouth every 4 (four) hours as needed.  20 tablet  0  . levofloxacin (LEVAQUIN) 500 MG tablet Take 500 mg by mouth daily. 7 day course starting on 11/16/2013       . Nepafenac (ILEVRO) 0.3 % SUSP Place 1 drop into the left eye daily.      Marland Kitchen olmesartan-hydrochlorothiazide (BENICAR HCT) 40-12.5 MG per tablet Take 1 tablet by mouth every morning.      Marland Kitchen oxycodone (OXY-IR) 5 MG capsule Take 5 mg by mouth every 4 (four) hours as needed for pain.      . OxyCODONE (OXYCONTIN) 10 mg T12A 12 hr tablet Take 10 mg by mouth every 12 (twelve) hours.       Scheduled: . sodium chloride   Intravenous STAT  . enoxaparin (LOVENOX) injection  80 mg Subcutaneous Q12H  . hydrochlorothiazide  12.5  mg Oral Daily  . irbesartan  300 mg Oral Daily  . latanoprost  1 drop Both Eyes QHS  . nepafenac  1 drop Left Eye Daily  . OxyCODONE  10 mg Oral Q12H  . piperacillin-tazobactam (ZOSYN)  IV  3.375 g Intravenous Q8H  . prednisoLONE acetate  1 drop Left Eye Daily  . vancomycin  750 mg Intravenous Q12H   Continuous:  WEX:HBZJIRCVE hydroxide, ondansetron (ZOFRAN) IV, ondansetron (ZOFRAN) IV, ondansetron, oxyCODONE  Assesment: She has a pulmonary embolus. She has pneumonia likely related to her cancer. She has wide spread widely metastatic pancreatic cancer. Active Problems:   Pulmonary embolism   Pneumonia    Plan: Continue current Lovenox. She may be a candidate for one of the newer agents but I think first we need to decide about whether she's going to have chemotherapy et Ronney Asters.    LOS: 1 day   Danalee Flath L 11/22/2013, 8:42 AM

## 2013-11-22 NOTE — Progress Notes (Signed)
ANTICOAGULATION CONSULT NOTE - Initial Consult  Pharmacy Consult for Lovenox Indication: pulmonary embolus  Allergies  Allergen Reactions  . Aspirin     Makes my heart race   Patient Measurements: Height: 5' 6.5" (168.9 cm) Weight: 180 lb 8 oz (81.874 kg) IBW/kg (Calculated) : 60.45  Vital Signs: Temp: 98 F (36.7 C) (01/13 0610) Temp src: Oral (01/13 0610) BP: 97/63 mmHg (01/13 0610) Pulse Rate: 82 (01/13 0610)  Labs:  Recent Labs  11/21/13 2301 11/22/13 0513  HGB 13.4 12.6  HCT 39.1 37.0  PLT 157 160  CREATININE 1.02 0.90   Estimated Creatinine Clearance: 57.1 ml/min (by C-G formula based on Cr of 0.9).  Medical History: Past Medical History  Diagnosis Date  . Hypertension    Medications:  Scheduled:  . sodium chloride   Intravenous STAT  . enoxaparin (LOVENOX) injection  80 mg Subcutaneous Q12H  . hydrochlorothiazide  12.5 mg Oral Daily  . irbesartan  300 mg Oral Daily  . latanoprost  1 drop Both Eyes QHS  . nepafenac  1 drop Left Eye Daily  . OxyCODONE  10 mg Oral Q12H  . piperacillin-tazobactam (ZOSYN)  IV  3.375 g Intravenous Q8H  . prednisoLONE acetate  1 drop Left Eye Daily  . vancomycin  750 mg Intravenous Q12H   Assessment: 80yo female with pancreatic cancer has been sick for 2 weeks.  CXR reveals LL pneumonia and CT shows pulmonary embolism.  Estimated Creatinine Clearance: 57.1 ml/min (by C-G formula based on Cr of 0.9).  Goal of Therapy:  Anti-Xa level 0.6-1 units/ml 4hrs after LMWH dose given Monitor platelets by anticoagulation protocol: Yes   Plan:  Lovenox 80mg  SQ q12hrs (1mg /Kg per dose) Monitor CBC and renal fxn.  Hart Robinsons A 11/22/2013,8:33 AM

## 2013-11-22 NOTE — Care Management Note (Signed)
    Page 1 of 1   11/22/2013     11:36:12 AM   CARE MANAGEMENT NOTE 11/22/2013  Patient:  Ellen [name], Ellen [name]   Account Number:  [id number]  Date Initiated:  11/22/2013  Documentation initiated by:  Theophilus Kinds  Subjective/Objective Assessment:   Pt admitted from home with pneumonia and PE. Pt lives with her daughter and will return home at discharge. Pt is independent with ADL's.     Action/Plan:   No CM needs noted.   Anticipated DC Date:  11/25/2013   Anticipated DC Plan:  [place]  CM consult      Choice offered to / List presented to:             Status of service:  Completed, signed off Medicare Important Message given?   (If response is "NO", the following Medicare IM given date fields will be blank) Date Medicare IM given:   Date Additional Medicare IM given:    Discharge Disposition:  HOME/SELF CARE  Per UR Regulation:    If discussed at Long Length of Stay Meetings, dates discussed:    Comments:  11/22/13 Petronila, RN BSN CM

## 2013-11-22 NOTE — Progress Notes (Signed)
UR chart review completed.  

## 2013-11-23 MED ORDER — FLEET ENEMA 7-19 GM/118ML RE ENEM
1.0000 | ENEMA | Freq: Once | RECTAL | Status: AC
  Administered 2013-11-23: 1 via RECTAL

## 2013-11-23 MED ORDER — HYDROCORTISONE 2.5 % RE CREA
TOPICAL_CREAM | Freq: Two times a day (BID) | RECTAL | Status: DC
  Administered 2013-11-24: 01:00:00 via RECTAL
  Filled 2013-11-23: qty 28.35

## 2013-11-23 MED ORDER — APIXABAN 5 MG PO TABS: 5.0000 mg | ORAL_TABLET | Freq: Two times a day (BID) | ORAL | Status: DC

## 2013-11-23 MED ORDER — APIXABAN 5 MG PO TABS
10.0000 mg | ORAL_TABLET | Freq: Two times a day (BID) | ORAL | Status: DC
  Administered 2013-11-23 – 2013-11-24 (×3): 10 mg via ORAL
  Filled 2013-11-23 (×6): qty 2

## 2013-11-23 NOTE — Progress Notes (Signed)
NAMESHAMIYA, DEMERITT          ACCOUNT NO.:  [id number]  MEDICAL RECORD NO.:  [id number]  LOCATION:  A311                          FACILITY:  [facility]  PHYSICIAN:  Angus G. Everette Rank, MD   DATE OF BIRTH:  07/10/1934  DATE OF PROCEDURE: DATE OF DISCHARGE:                                PROGRESS NOTE   SUBJECTIVE:  This patient feels fairly well this morning with the exception of weakness.  She did cough up some phlegm.  She does have ongoing problem with pancreatic cancer with widespread metastasis involving the stomach, spleen, omentum and lungs.  She does have pleural effusion, on the left.  OBJECTIVE:  VITAL SIGNS:  Blood pressure 95/47, respirations 20, pulse 100, temp 98.6. HEENT:  Negative. NECK:  Supple.  No JVD or thyroid abnormalities. LUNGS:  Diminished breath sounds. HEART:  Regular rhythm.  No cardiomegaly. ABDOMEN:  No palpable organs or masses noted. EXTREMITIES:  Free of edema.  ASSESSMENT: 1. Pulmonary embolus. 2. Pneumonia postobstructive. 3. Widespread metastatic pancreatic cancer to the lung, spleen,     stomach, and omentum.  PLAN:  To continue current IV antibiotics.  The patient is also being seen by Pulmonology and Oncology.  Hospice has been discussed with the patient.     Angus G. Everette Rank, MD     AGM/MEDQ  D:  11/23/2013  T:  11/23/2013  Job:  [id number]

## 2013-11-23 NOTE — Progress Notes (Signed)
Subjective: Patient seen in bed.  She is in good spirits.  2 grandchildren are at the bedside.  She denies any complaints this AM.  She remains in denial regarding her malignancy and its prognosis.  She is not interested in Hospice at this time.   Will defer further discussion to attending/PCP as we have no treatments to offer the patient.  She would be best served with Hospice.   I provided the patient education regarding her PE.  Objective: Vital signs in last 24 hours: Temp:  [98.6 F (37 C)-99.5 F (37.5 C)] 98.6 F (37 C) (01/14 0443) Pulse Rate:  [87-100] 100 (01/13 2131) Resp:  [18-20] 20 (01/14 0443) BP: (95-116)/(47-53) 95/47 mmHg (01/14 0443) SpO2:  [91 %-93 %] 91 % (01/14 0443)  Intake/Output from previous day: 01/13 0800 - 01/14 0759 In: 720 [P.O.:720] Out: -  Intake/Output this shift:    General appearance: alert, cooperative, appears older than stated age and pleasant and smiling.  Lab Results:   Recent Labs  11/21/13 2301 11/22/13 0513  WBC 18.2* 14.0*  HGB 13.4 12.6  HCT 39.1 37.0  PLT 157 160   BMET  Recent Labs  11/21/13 2301 11/22/13 0513  NA 134* 136*  K 4.1 3.4*  CL 93* 96  CO2 24 22  GLUCOSE 141* 121*  BUN 24* 20  CREATININE 1.02 0.90  CALCIUM 9.9 9.2    Studies/Results: Dg Chest 2 View  11/21/2013   CLINICAL DATA:  Cough, congestion, shortness of breath  EXAM: CHEST  2 VIEW  COMPARISON:  DG CHEST 2 VIEW dated 11/11/2013; CT ABD - PELV W/ CM dated 09/20/2013  FINDINGS: There is a small left pleural effusion. There is left basilar airspace disease concerning for pneumonia. The right lung is clear. There is no pneumothorax. Normal cardiomediastinal silhouette. Unremarkable osseous structures.  IMPRESSION: Left lower lobe pneumonia. Small parapneumonic effusion. Given the persistent abnormality further evaluation with a CT of the chest is recommended.   Electronically Signed   By: Kathreen Devoid   On: 11/21/2013 11:06   Ct Angio Chest Pe W/cm  &/or Wo Cm  11/22/2013   CLINICAL DATA:  Recent diagnosis of pancreatic cancer in November, 2014, presenting with fever, epigastric abdominal pain, and left-sided chest pain. Surgical history includes cholecystectomy and hysterectomy.  EXAM: CT ANGIOGRAPHY CHEST  CT ABDOMEN AND PELVIS WITH CONTRAST  TECHNIQUE: Multidetector CT imaging of the chest was performed using the standard protocol during bolus administration of intravenous contrast. Multiplanar CT image reconstructions including MIPs were obtained to evaluate the vascular anatomy. Multidetector CT imaging of the abdomen and pelvis was performed using the standard protocol during bolus administration of intravenous contrast.  CONTRAST:  166mL OMNIPAQUE IOHEXOL 350 MG/ML IV. Oral contrast was not administered at the request of the emergency physician.  COMPARISON:  No prior CT chest.  CT abdomen and pelvis 09/20/2013.  FINDINGS: CTA CHEST FINDINGS  Contrast opacification of the pulmonary arteries is good. Filling defects involving branches of the right lower lobe pulmonary artery. No filling defects identified elsewhere in either lung. No evidence of right heart strain. Heart mildly enlarged. No pericardial effusion. No visible coronary atherosclerosis. Mild atherosclerosis involving the thoracic aorta.  Large left pleural effusion. Dense consolidation with air bronchograms in the left lower lobe with a "drowned lung" appearance. Left lower lobe bronchi markedly narrowed by right hilar lymphadenopathy which will be detailed below. Central airways otherwise patent. No confluent airspace consolidation elsewhere in either lung. Multiple bilateral pulmonary nodules. Irregular,  spiculated nodule in the right upper lobe measures approximately 1.7 x 1.4 x 1.4 cm. Minimal interstitial opacities in both lungs. No right pleural effusion. Mild atelectasis deep in the right lower lobe.  Extensive bilateral hilar and mediastinal lymphadenopathy. Index right hilar nodes  approximate 3.3 x 2.2 cm and index left hilar nodes approximate 3.2 x 1.4 cm (series 5, image 40). Index subcarinal mediastinal lymph node approximates 2.2 x 3.7 cm (image 42). No axillary lymphadenopathy. Visualized thyroid gland unremarkable.  Bone window images demonstrate diffuse thoracic spondylosis but no evidence of osseous metastatic disease.  CT ABDOMEN and PELVIS FINDINGS  Necrotic mass involving the tail of the pancreas with extension to the splenic hilum and probable involvement of the spleen measures approximately 6.9 x 5.3 by 5.6 cm (series 16, image 15 and series 13, image 57), increased since the prior examination. The mass likely also involves the gastric fundus along its greater curvature. Numerous hepatic parenchymal metastases which were not present on the prior examination. Largest index lesion in the anterior segment right lobe measures approximately 3.0 x 3.3 x 4.0 cm (series 16, image 7 and series 13, image 55). Large metastasis involving the lower pole of the spleen, with several smaller splenic metastases, also a new finding; the largest metastasis in the lower pole of the spleen measures approximately 4.6 x 3.3 x 2.1 cm. Interval development of splenic vein thrombosis.  Numerous normal sized lymph nodes in the retroperitoneum and mesentery, some of which have increased in size since the prior examination. Interval development of widespread omental metastatic disease throughout the abdomen and pelvis. Small amount of ascites in the pelvis.  Normal adrenal glands. Nonobstructing bilateral lower pole renal calculi, the largest stone approximating 7 mm in a lower pole calix of the left kidney. Multiple bilateral parapelvic renal cysts. Large cortical cyst arising from the mid right kidney approximating 5.3 x 4.4 cm. No obstructing ureteral calculi. Mild aortoiliac atherosclerosis.  Small bowel of normal caliber throughout, though some of the anterior small bowel loops may be tethered by the  omental metastatic disease. Moderate stool burden throughout the colon which is normal in appearance.  Urinary bladder unremarkable. Uterus surgically absent. Numerous pelvic phleboliths.  Bone window images demonstrate degenerative changes throughout the lumbar spine, degenerative changes in the sacroiliac joints, and degenerative changes in the symphysis pubis.  Review of the MIP images confirms the above findings.  IMPRESSION: CHEST:  1. Acute pulmonary emboli involving segmental branches of the right lower lobe pulmonary artery. Clot burden is small. 2. Numerous pulmonary parenchymal metastases, the largest index lesion in the right upper lobe measured above. 3. Extensive bilateral hilar lymphadenopathy. Metastatic mediastinal lymphadenopathy. Index nodes are measured above. 4. Narrowing of the left lower lobe bronchus by a the left hilar lymphadenopathy, with postobstructive pneumonia involving the left lower lobe. 5. Large left pleural effusion.  ABDOMEN/PELVIS:  1. Interval increase in size of the necrotic mass involving the tail of the pancreas with likely extension into the spleen at the hilum an likely involvement of the fundus of the stomach. Measurements are given above. 2. Interval development of numerous liver metastases. 3. Interval development of widespread omental metastatic disease. 4. Small amount of ascites in the pelvis. 5. Nonobstructing bilateral lower pole renal calculi. 6. Bilateral parapelvic renal cysts and approximate 5 cm cortical cyst arising from the right kidney. 7. These results were called by telephone at the time of interpretation on 11/22/2013 at 2:15 AM to Dr. Noemi Chapel , who verbally acknowledged these  results.   Electronically Signed   By: Evangeline Dakin M.D.   On: 11/22/2013 02:17   Ct Abdomen Pelvis W Contrast  11/22/2013   CLINICAL DATA:  Recent diagnosis of pancreatic cancer in November, 2014, presenting with fever, epigastric abdominal pain, and left-sided chest  pain. Surgical history includes cholecystectomy and hysterectomy.  EXAM: CT ANGIOGRAPHY CHEST  CT ABDOMEN AND PELVIS WITH CONTRAST  TECHNIQUE: Multidetector CT imaging of the chest was performed using the standard protocol during bolus administration of intravenous contrast. Multiplanar CT image reconstructions including MIPs were obtained to evaluate the vascular anatomy. Multidetector CT imaging of the abdomen and pelvis was performed using the standard protocol during bolus administration of intravenous contrast.  CONTRAST:  132mL OMNIPAQUE IOHEXOL 350 MG/ML IV. Oral contrast was not administered at the request of the emergency physician.  COMPARISON:  No prior CT chest.  CT abdomen and pelvis 09/20/2013.  FINDINGS: CTA CHEST FINDINGS  Contrast opacification of the pulmonary arteries is good. Filling defects involving branches of the right lower lobe pulmonary artery. No filling defects identified elsewhere in either lung. No evidence of right heart strain. Heart mildly enlarged. No pericardial effusion. No visible coronary atherosclerosis. Mild atherosclerosis involving the thoracic aorta.  Large left pleural effusion. Dense consolidation with air bronchograms in the left lower lobe with a "drowned lung" appearance. Left lower lobe bronchi markedly narrowed by right hilar lymphadenopathy which will be detailed below. Central airways otherwise patent. No confluent airspace consolidation elsewhere in either lung. Multiple bilateral pulmonary nodules. Irregular, spiculated nodule in the right upper lobe measures approximately 1.7 x 1.4 x 1.4 cm. Minimal interstitial opacities in both lungs. No right pleural effusion. Mild atelectasis deep in the right lower lobe.  Extensive bilateral hilar and mediastinal lymphadenopathy. Index right hilar nodes approximate 3.3 x 2.2 cm and index left hilar nodes approximate 3.2 x 1.4 cm (series 5, image 40). Index subcarinal mediastinal lymph node approximates 2.2 x 3.7 cm (image  42). No axillary lymphadenopathy. Visualized thyroid gland unremarkable.  Bone window images demonstrate diffuse thoracic spondylosis but no evidence of osseous metastatic disease.  CT ABDOMEN and PELVIS FINDINGS  Necrotic mass involving the tail of the pancreas with extension to the splenic hilum and probable involvement of the spleen measures approximately 6.9 x 5.3 by 5.6 cm (series 16, image 15 and series 13, image 57), increased since the prior examination. The mass likely also involves the gastric fundus along its greater curvature. Numerous hepatic parenchymal metastases which were not present on the prior examination. Largest index lesion in the anterior segment right lobe measures approximately 3.0 x 3.3 x 4.0 cm (series 16, image 7 and series 13, image 55). Large metastasis involving the lower pole of the spleen, with several smaller splenic metastases, also a new finding; the largest metastasis in the lower pole of the spleen measures approximately 4.6 x 3.3 x 2.1 cm. Interval development of splenic vein thrombosis.  Numerous normal sized lymph nodes in the retroperitoneum and mesentery, some of which have increased in size since the prior examination. Interval development of widespread omental metastatic disease throughout the abdomen and pelvis. Small amount of ascites in the pelvis.  Normal adrenal glands. Nonobstructing bilateral lower pole renal calculi, the largest stone approximating 7 mm in a lower pole calix of the left kidney. Multiple bilateral parapelvic renal cysts. Large cortical cyst arising from the mid right kidney approximating 5.3 x 4.4 cm. No obstructing ureteral calculi. Mild aortoiliac atherosclerosis.  Small bowel of normal caliber throughout,  though some of the anterior small bowel loops may be tethered by the omental metastatic disease. Moderate stool burden throughout the colon which is normal in appearance.  Urinary bladder unremarkable. Uterus surgically absent. Numerous pelvic  phleboliths.  Bone window images demonstrate degenerative changes throughout the lumbar spine, degenerative changes in the sacroiliac joints, and degenerative changes in the symphysis pubis.  Review of the MIP images confirms the above findings.  IMPRESSION: CHEST:  1. Acute pulmonary emboli involving segmental branches of the right lower lobe pulmonary artery. Clot burden is small. 2. Numerous pulmonary parenchymal metastases, the largest index lesion in the right upper lobe measured above. 3. Extensive bilateral hilar lymphadenopathy. Metastatic mediastinal lymphadenopathy. Index nodes are measured above. 4. Narrowing of the left lower lobe bronchus by a the left hilar lymphadenopathy, with postobstructive pneumonia involving the left lower lobe. 5. Large left pleural effusion.  ABDOMEN/PELVIS:  1. Interval increase in size of the necrotic mass involving the tail of the pancreas with likely extension into the spleen at the hilum an likely involvement of the fundus of the stomach. Measurements are given above. 2. Interval development of numerous liver metastases. 3. Interval development of widespread omental metastatic disease. 4. Small amount of ascites in the pelvis. 5. Nonobstructing bilateral lower pole renal calculi. 6. Bilateral parapelvic renal cysts and approximate 5 cm cortical cyst arising from the right kidney. 7. These results were called by telephone at the time of interpretation on 11/22/2013 at 2:15 AM to Dr. Noemi Chapel , who verbally acknowledged these results.   Electronically Signed   By: Evangeline Dakin M.D.   On: 11/22/2013 02:17    Medications: I have reviewed the patient's current medications.  Assessment/Plan: 1. Stage IV Metastatic Malignancy, likely of pancreatic origin due to initial CT in November 2014 demonstrating a large tail of pancreas mass measuring 5 cm.   Now with significant progression.  Surgery is no longer an option for the patient.  Chemotherapy regimens for  pancreatic cancer are too toxic for this patient with only possible marginal benefit and improvement in progression-free survival.  Age, comorbidities, and extent of disease preclude chemotherapy administration. 2. Elevated CA 19-9 3. Malignancy-induced pulmonary embolism.  Recommend transition to a Factor Xa-inhibitor due to their effectiveness in malignancy-induced VTE.  Recommend Xarelto versus Eliquis. 4. DNR 5. Recommend Hospice.   6. Terminal prognosis.  Suspect 1-3 months of life and certainly Hospice appropriate with less than 6 months of life.  Patient and plan discussed with Dr. Farrel Gobble and he is in agreement with the aforementioned.     LOS: 2 days    KEFALAS,THOMAS 11/23/2013

## 2013-11-23 NOTE — Progress Notes (Signed)
She says she's been thinking about all the information that was presented to her yesterday. At this point I don't think she's going to agree to hospice care. We discussed that and I think she's having a little bit of denial which is normal. As far as anticoagulation goes since she wishes to go home soon I think we should switch her from her current medication to eliquis. I think that we'll be safer for her then warfarin for her at home

## 2013-11-23 NOTE — Progress Notes (Signed)
ANTICOAGULATION CONSULT NOTE -   Pharmacy Consult for Lovenox switched to Eliquis Indication: pulmonary embolus  Allergies  Allergen Reactions  . Aspirin     Makes my heart race   Patient Measurements: Height: 5' 6.5" (168.9 cm) Weight: 180 lb 8 oz (81.874 kg) IBW/kg (Calculated) : 60.45  Vital Signs: Temp: 98.6 F (37 C) (01/14 0443) Temp src: Oral (01/14 0443) BP: 95/47 mmHg (01/14 0443) Pulse Rate: 100 (01/13 2131)  Labs:  Recent Labs  11/21/13 2301 11/22/13 0513  HGB 13.4 12.6  HCT 39.1 37.0  PLT 157 160  CREATININE 1.02 0.90   Estimated Creatinine Clearance: 57.1 ml/min (by C-G formula based on Cr of 0.9).  Medical History: Past Medical History  Diagnosis Date  . Hypertension    Medications:  Scheduled:  . apixaban  10 mg Oral BID  . [START ON 11/30/2013] apixaban  5 mg Oral BID  . hydrochlorothiazide  12.5 mg Oral Daily  . irbesartan  300 mg Oral Daily  . latanoprost  1 drop Both Eyes QHS  . nepafenac  1 drop Left Eye Daily  . OxyCODONE  10 mg Oral Q12H  . piperacillin-tazobactam (ZOSYN)  IV  3.375 g Intravenous Q8H  . prednisoLONE acetate  1 drop Left Eye Daily  . vancomycin  750 mg Intravenous Q12H   Assessment: 80yo female with pancreatic cancer has been sick for 2 weeks.  CXR reveals LL pneumonia and CT shows pulmonary embolism.  Initially started on full dose Lovenox.  Plan is for pt to go to hospice care and is being switched to Apixaban.  Estimated Creatinine Clearance: 57.1 ml/min (by C-G formula based on Cr of 0.9).  Goal of Therapy:  Anticoagulation with Apixaban Monitor platelets by anticoagulation protocol: Yes   Plan:  Eliquis 10mg  PO bid x 7 days then 5mg  PO BID thereafter. Monitor CBC and s/sx of bleeding  Hart Robinsons A 11/23/2013,8:53 AM

## 2013-11-24 MED ORDER — MILK AND MOLASSES ENEMA
1.0000 | Freq: Once | RECTAL | Status: AC
  Administered 2013-11-24: 250 mL via RECTAL

## 2013-11-24 MED ORDER — APIXABAN 5 MG PO TABS: 5.0000 mg | ORAL_TABLET | Freq: Two times a day (BID) | ORAL | Status: AC

## 2013-11-24 MED ORDER — HYDROCORTISONE 2.5 % RE CREA
TOPICAL_CREAM | RECTAL | Status: AC
  Filled 2013-11-24: qty 28.35

## 2013-11-24 NOTE — Progress Notes (Signed)
She says she feels much better and hopes to have discharge later today I will plan to sign off.  Thanks for allow me to see her with you

## 2013-11-24 NOTE — Progress Notes (Signed)
Subjective: Patient seen sitting up in bed with her daughter at the bedside.  Her daughter is the wife of a deceased patient of ours, Audie Clear.  It was so nice to see her as well.  The patient reports that she is feeling well.  She had a BM today.  Objective: Vital signs in last 24 hours: Temp:  [98 F (36.7 C)-98.7 F (37.1 C)] 98.1 F (36.7 C) (01/15 1454) Pulse Rate:  [83-105] 91 (01/15 1454) Resp:  [20] 20 (01/15 1454) BP: (107-115)/(48-67) 114/48 mmHg (01/15 1454) SpO2:  [90 %-93 %] 90 % (01/15 1454)  Intake/Output from previous day: 01/14 0800 - 01/15 0759 In: 1620 [P.O.:720; IV Piggyback:900] Out: -  Intake/Output this shift:    General appearance: alert, cooperative and no distress  Lab Results:   Recent Labs  11/21/13 2301 11/22/13 0513  WBC 18.2* 14.0*  HGB 13.4 12.6  HCT 39.1 37.0  PLT 157 160   BMET  Recent Labs  11/21/13 2301 11/22/13 0513  NA 134* 136*  K 4.1 3.4*  CL 93* 96  CO2 24 22  GLUCOSE 141* 121*  BUN 24* 20  CREATININE 1.02 0.90  CALCIUM 9.9 9.2    Studies/Results: No results found.  Medications: I have reviewed the patient's current medications.  Assessment/Plan: 1. Stage IV Metastatic Malignancy, likely of pancreatic origin due to initial CT in November 2014 demonstrating a large tail of pancreas mass measuring 5 cm. Now with significant progression. Surgery is no longer an option for the patient. Chemotherapy regimens for pancreatic cancer are too toxic for this patient with only possible marginal benefit and improvement in progression-free survival. Age, comorbidities, and extent of disease preclude chemotherapy administration.  2. Elevated CA 19-9  3. Malignancy-induced pulmonary embolism. Recommend transition to a Factor Xa-inhibitor due to their effectiveness in malignancy-induced VTE. Recommend Xarelto versus Eliquis.  4. DNR  5. Recommend Hospice.  6. Terminal prognosis. Suspect 1-3 months of life and certainly Hospice  appropriate with less than 6 months of life. 7. Oncology will sign off as we do not have anything to offer.  We recommend Hospice as she would be best served with the services they can provide.  We would be happy to see the patient on an outpatient basis if needed.   Patient and plan discussed with Dr. Farrel Gobble and he is in agreement with the aforementioned.      LOS: 3 days    Tavaughn Silguero 11/24/2013

## 2013-11-25 NOTE — Discharge Summary (Signed)
Ellen [name], Ellen [name]          ACCOUNT NO.:  [id number]  MEDICAL RECORD NO.:  [id number]  LOCATION:                                 FACILITY:  PHYSICIAN:  Ellen Cuny G. Everette Rank, MD   DATE OF BIRTH:  09-06-1934  DATE OF ADMISSION:  11/21/2013 DATE OF DISCHARGE:  01/15/2015LH                              DISCHARGE SUMMARY   A 80 year old female, admitted to the hospital November 22, 2013, discharged November 24, 2013.  DIAGNOSES: 1. Adenocarcinoma of the pancreas with widespread metastasis. 2. Pulmonary embolus. 3. Postobstructive pneumonia and left pleural effusion.  The patient has metastatic disease involving stomach, liver, and lungs. She is stable.  This patient presented to the emergency department initially with shortness of breath.  She had cough and fever over a period of approximately 2 weeks.  Had been on antibiotics as an outpatient.  On admission, was found to have left lower lobe pneumonia on chest x-ray.  CT of the chest on admission showed pulmonary embolization with widespread metastatic disease.  PHYSICAL EXAMINATION:  GENERAL:  Alert female with blood pressure 114/57, pulse 118, temp 98.2, respirations 16. HEENT:  Negative. NECK:  Supple.  No JVD or thyroid abnormalities. HEART:  Regular rhythm.  No murmurs. LUNGS:  Clear to P and A, but occasional rales heard over the lower lung field. ABDOMEN:  No palpable organs or masses.  No organomegaly. NEUROLOGIC:  Cranial nerves intact.  No motor or sensory abnormalities. EXTREMITIES:  Free of edema.  LABORATORY DATA:  Admission CBC; WBC 18,200, with hemoglobin 13.4, hematocrit 39.1.  Chemistries on admission, sodium 134, potassium 4.1, chloride 93, CO2 24, glucose 141, BUN 24, creatinine 1.02, calcium 9.9. Influenza panel negative.  Subsequent CBC; WBC 14,000, hemoglobin 12.6, hematocrit 37.0.  Chemistries on November 22, 2013, sodium 136, potassium 3.4, chloride 96, CO2 22, BUN 20, creatinine 0.90.  RADIOLOGY:   Chest x-ray on admission showed left lower lobe pneumonia, with a small parapneumonic effusion.  CT angio of chest.  IMPRESSION: 1. Acute pulmonary emboli involving segmental branch of the right     lower lobe pulmonary artery. 2. Numerous pulmonary parenchymal metastases, largest in the right     upper lobe. 3. Extensive bilateral hilar lymphadenopathy.  Metastatic mediastinal     lymphadenopathy. 4. Narrowing of the left lower lobe bronchus, bilateral hilar     lymphadenopathy with postobstructive pneumonia involving the left     lower lobe. 5. Large left pleural effusion.  CT of the abdomen increased in size     of necrotic mass involving the tail of the pancreas, likely     extension to the spleen and involvement of the fundus of stomach.     Intervally, developed numerous liver metastases, widespread omental     metastatic disease, small amount of ascites in pelvis,     nonobstructing bilateral lower pole renal calculus, bilateral     parapelvic renal cyst.  HOSPITAL COURSE:  The patient was hydrated with IV fluids.  She was placed on IV vancomycin 750 mg every 12 hours, as well as Zosyn 3.375 IV every 8 hours.  She was started on Eliquis 10 mg b.i.d., Microzide 12.5 mg daily, Avapro 300 mg daily, OxyContin 10  mg every 12 hours, prednisolone 1 drop into left eye daily.  The patient was fairly comfortable through the hospital stay, except for the fact she did have obstipation which required enemas for relief.  She was seen in consultation by both Pulmonology and Oncology Service.  Oncology Service discussed her case with Dr. Eugenia Pancoast at [facility], and was elected not to proceed with surgery because of widespread metastasis.  It was felt radiation was not an option and chemotherapy was not an option either. Because the drugs were too toxic for her, it was elected to start the patient on Eliquis instead of Coumadin.     Ellen [name] G. Everette Rank, MD     AGM/MEDQ  D:  11/24/2013  T:   11/25/2013  Job:  [id number]

## 2013-11-25 NOTE — Discharge Summary (Signed)
Ellen [name], Ellen [name]          ACCOUNT NO.:  [id number]  MEDICAL RECORD NO.:  [id number]  LOCATION:  A311                          FACILITY:  [facility]  PHYSICIAN:  Cecille Mcclusky G. Everette Rank, MD   DATE OF BIRTH:  1934/06/15  DATE OF ADMISSION:  11/21/2013 DATE OF DISCHARGE:  01/15/2015LH                              DISCHARGE SUMMARY   ADDENDUM:  The patient was off hospice care, but she has decided that she does not want hospice care now and desires to go home.  She will be going home on the following medications: 1. Benicar HCT 40/12.5 mg daily. 2. Lumigan 0.03% ophthalmic solution, 1 drop into both eyes at     bedtime. 3. Oxycodone 10 mg every 12 hours. 4. Durezol 0.05% 1 drop in the left eye daily. 5. Ilevro 0.3% 1 drop in the left eye daily. 6. Levaquin 500 mg daily.     [name] G. Everette Rank, MD     AGM/MEDQ  D:  11/24/2013  T:  11/25/2013  Job:  [id number]

## 2013-11-27 LAB — CULTURE, BLOOD (ROUTINE X 2)
Culture: NO GROWTH
Culture: NO GROWTH

## 2013-11-28 ENCOUNTER — Other Ambulatory Visit (HOSPITAL_COMMUNITY): Payer: Self-pay | Admitting: Family Medicine

## 2013-11-28 ENCOUNTER — Ambulatory Visit (HOSPITAL_COMMUNITY)
Admission: RE | Admit: 2013-11-28 | Discharge: 2013-11-28 | Disposition: A | Discharge status: Home / Self Care | Payer: Medicare Other | Source: Ambulatory Visit | Attending: Family Medicine | Admitting: Family Medicine

## 2013-11-28 DIAGNOSIS — J189 Pneumonia, unspecified organism: Secondary | ICD-10-CM

## 2013-11-28 DIAGNOSIS — C259 Malignant neoplasm of pancreas, unspecified: Secondary | ICD-10-CM | POA: Insufficient documentation

## 2013-11-28 DIAGNOSIS — J90 Pleural effusion, not elsewhere classified: Secondary | ICD-10-CM | POA: Insufficient documentation

## 2013-11-28 DIAGNOSIS — Z8507 Personal history of malignant neoplasm of pancreas: Secondary | ICD-10-CM

## 2013-11-28 DIAGNOSIS — C7800 Secondary malignant neoplasm of unspecified lung: Secondary | ICD-10-CM

## 2013-12-19 ENCOUNTER — Telehealth: Payer: Self-pay

## 2013-12-19 NOTE — Telephone Encounter (Signed)
Patient past away @ Home per [facility] in [place] [employer] °

## 2014-01-08 DEATH — deceased

## 2014-05-03 IMAGING — CR DG ABDOMEN ACUTE W/ 1V CHEST
4 series · 4 of 4 positions shown · non-contrast
Comparison: Chest radiograph performed 09/05/2005, and CT of the
abdomen and pelvis performed 09/20/2013

CLINICAL DATA: Abdominal pain and distention.

EXAM:
ACUTE ABDOMEN SERIES (ABDOMEN 2 VIEW & CHEST 1 VIEW)

[view not recorded (1 of 4)]
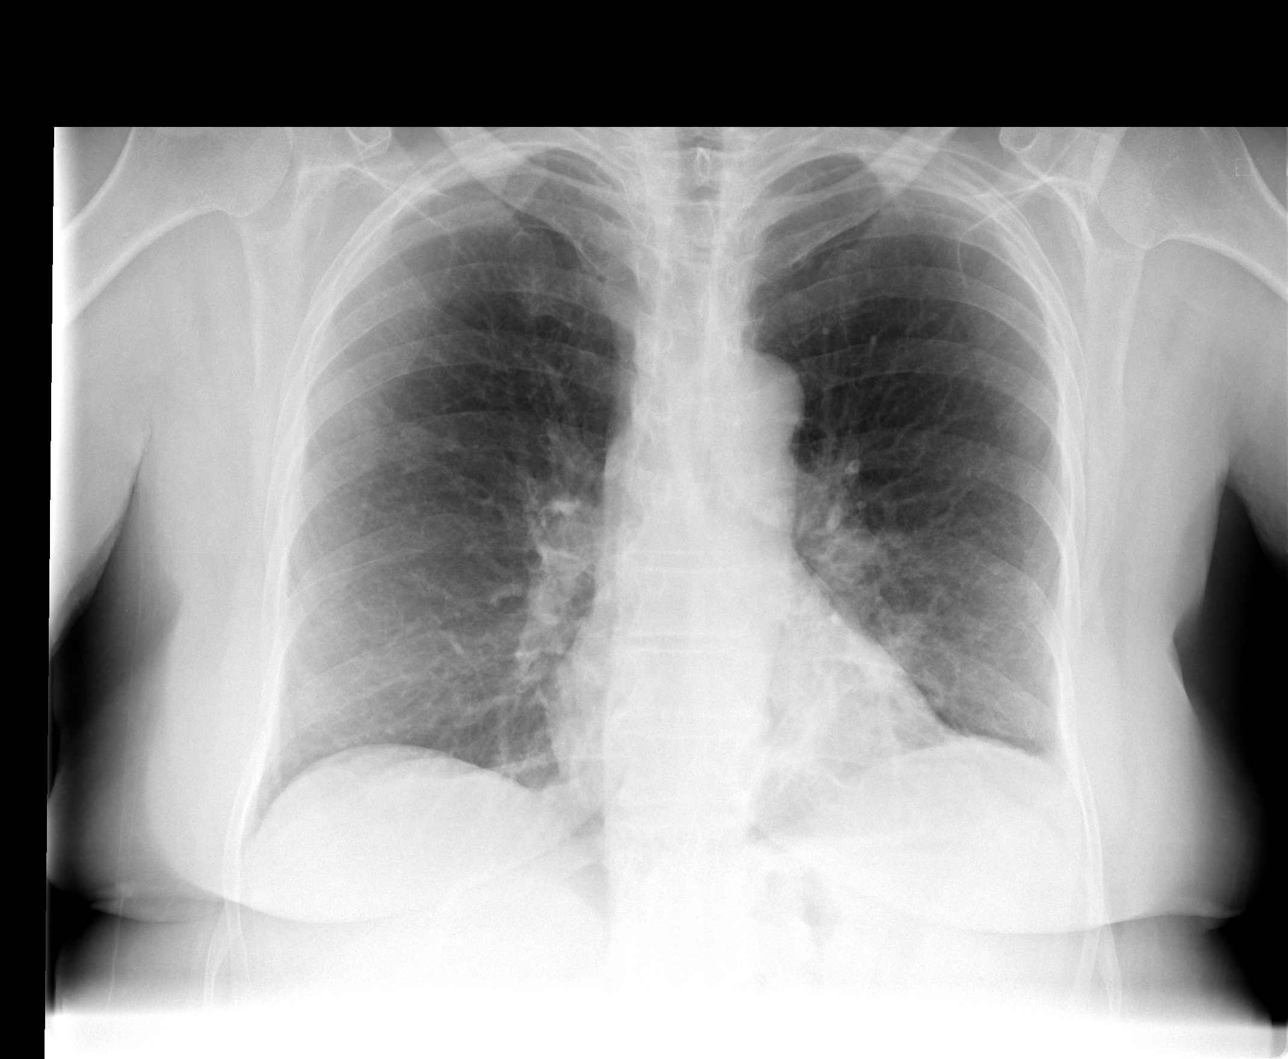

[view not recorded (2 of 4)]
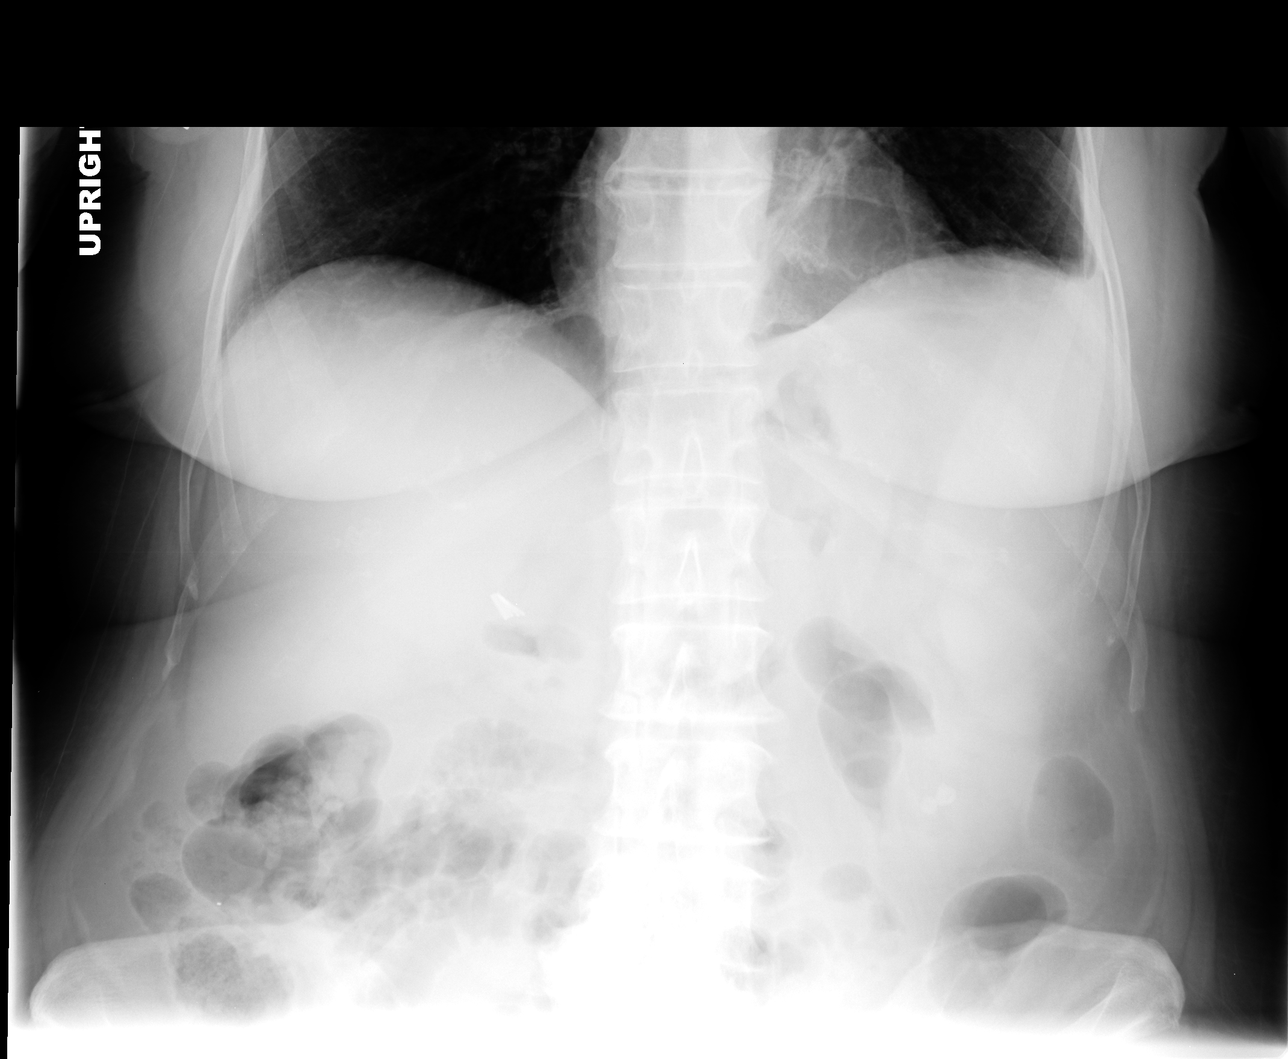

[view not recorded (3 of 4)]
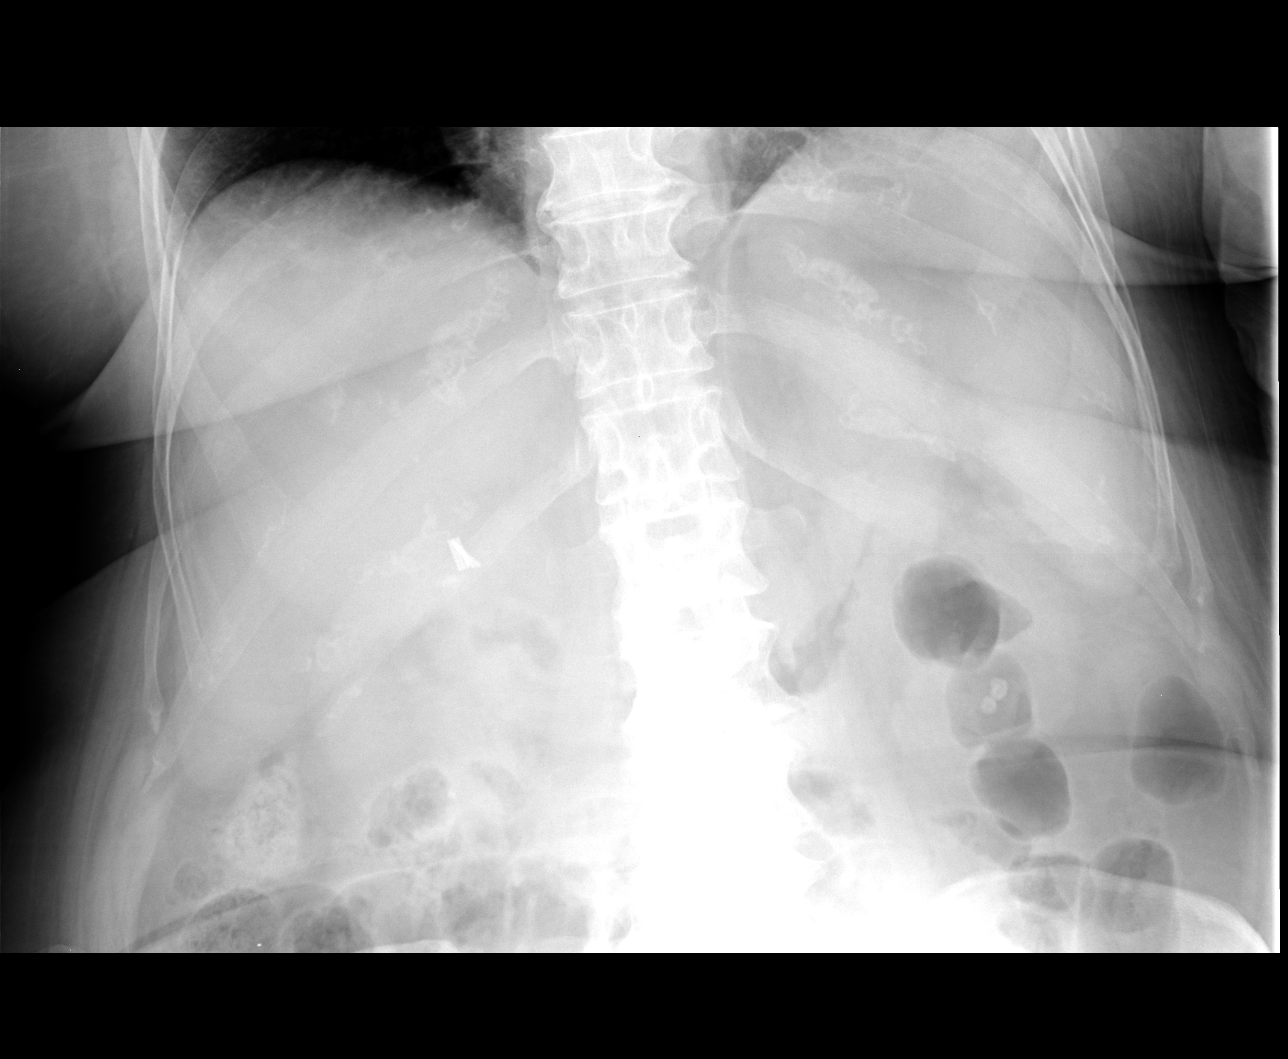

[view not recorded (4 of 4)]
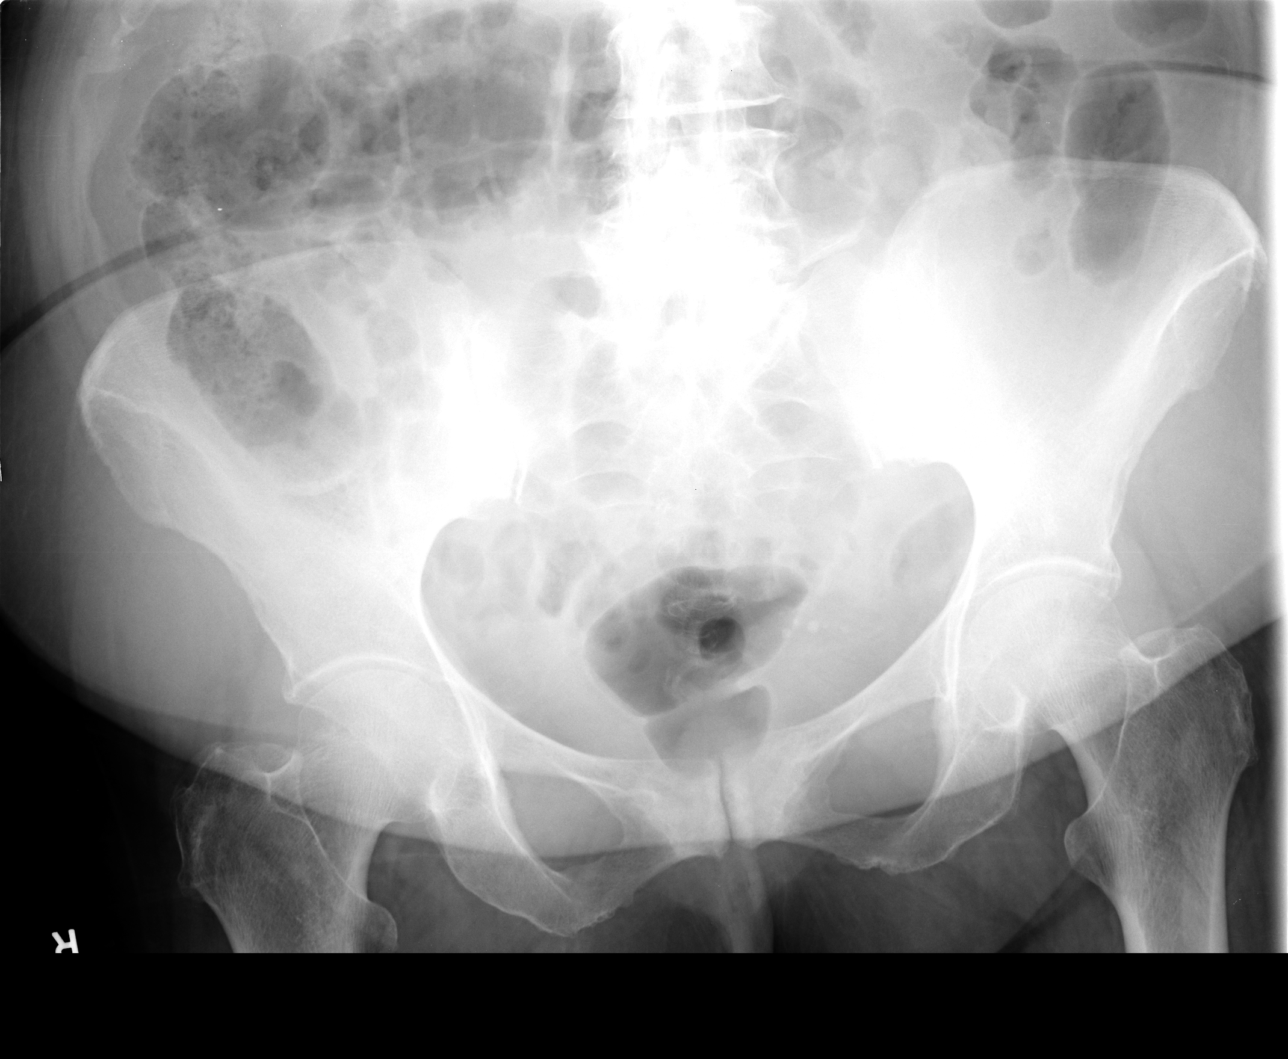

[4 of 4 positions shown; findings below may reference images not displayed]

FINDINGS: The lungs are well-aerated. Mild left basilar opacity likely
reflects atelectasis. There is no evidence of pleural effusion or
pneumothorax. The cardiomediastinal silhouette is within normal
limits.

The visualized bowel gas pattern is unremarkable. Scattered stool
and air are seen within the colon; there is no evidence of small
bowel dilatation to suggest obstruction. No free intra-abdominal air
is identified on the provided upright view. Clips are noted within
the right upper quadrant, reflecting prior cholecystectomy.

No acute osseous abnormalities are seen; sclerotic change is noted
at the sacroiliac joints.
IMPRESSION: 1. Unremarkable bowel gas pattern; no free intra-abdominal air seen.
2. Mild left basilar airspace opacity likely reflects atelectasis;
lungs otherwise grossly clear.
